# Patient Record
Sex: Female | Born: 1990 | Race: White | Hispanic: No | Marital: Single | Smoking: Former smoker
Health system: Southern US, Community
[De-identification: ages and names within clinical notes are randomized; demographics above are authoritative.]

## PROBLEM LIST (undated history)

## (undated) DIAGNOSIS — N390 Urinary tract infection, site not specified: Secondary | ICD-10-CM

## (undated) DIAGNOSIS — N289 Disorder of kidney and ureter, unspecified: Secondary | ICD-10-CM

## (undated) DIAGNOSIS — F419 Anxiety disorder, unspecified: Secondary | ICD-10-CM

---

## 2014-03-26 ENCOUNTER — Emergency Department (HOSPITAL_BASED_OUTPATIENT_CLINIC_OR_DEPARTMENT_OTHER)
Admission: EM | Admit: 2014-03-26 | Discharge: 2014-03-26 | Disposition: A | Discharge status: Home / Self Care | Payer: Self-pay | Attending: Emergency Medicine | Admitting: Emergency Medicine

## 2014-03-26 ENCOUNTER — Encounter (HOSPITAL_BASED_OUTPATIENT_CLINIC_OR_DEPARTMENT_OTHER): Payer: Self-pay

## 2014-03-26 DIAGNOSIS — R251 Tremor, unspecified: Secondary | ICD-10-CM | POA: Insufficient documentation

## 2014-03-26 DIAGNOSIS — Z720 Tobacco use: Secondary | ICD-10-CM | POA: Insufficient documentation

## 2014-03-26 DIAGNOSIS — Z79899 Other long term (current) drug therapy: Secondary | ICD-10-CM | POA: Insufficient documentation

## 2014-03-26 DIAGNOSIS — Z3202 Encounter for pregnancy test, result negative: Secondary | ICD-10-CM | POA: Insufficient documentation

## 2014-03-26 DIAGNOSIS — G252 Other specified forms of tremor: Secondary | ICD-10-CM

## 2014-03-26 DIAGNOSIS — F419 Anxiety disorder, unspecified: Secondary | ICD-10-CM | POA: Insufficient documentation

## 2014-03-26 HISTORY — DX: Anxiety disorder, unspecified: F41.9

## 2014-03-26 LAB — BASIC METABOLIC PANEL
Anion gap: 2 — ABNORMAL LOW (ref 5–15)
BUN: 12 mg/dL (ref 6–23)
CO2: 28 mmol/L (ref 19–32)
Calcium: 8.8 mg/dL (ref 8.4–10.5)
Chloride: 109 mmol/L (ref 96–112)
Creatinine, Ser: 0.81 mg/dL (ref 0.50–1.10)
GFR calc Af Amer: >90 mL/min (ref >90)
GFR calc non Af Amer: >90 mL/min (ref >90)
Glucose, Bld: 82 mg/dL (ref 70–99)
Potassium: 3.6 mmol/L (ref 3.5–5.1)
Sodium: 139 mmol/L (ref 135–145)

## 2014-03-26 LAB — URINALYSIS, ROUTINE W REFLEX MICROSCOPIC
Bilirubin Urine: NEGATIVE
Glucose, UA: NEGATIVE mg/dL
Hgb urine dipstick: NEGATIVE
KETONES UR: NEGATIVE mg/dL
Leukocytes, UA: NEGATIVE
NITRITE: NEGATIVE
Protein, ur: NEGATIVE mg/dL
SPECIFIC GRAVITY, URINE: 1.025 (ref 1.005–1.030)
Urobilinogen, UA: 1 mg/dL (ref 0.0–1.0)
pH: 6 (ref 5.0–8.0)

## 2014-03-26 LAB — CBC WITH DIFFERENTIAL/PLATELET
Basophils Absolute: 0 10*3/uL (ref 0.0–0.1)
Basophils Relative: 0 % (ref 0–1)
Eosinophils Absolute: 0.3 10*3/uL (ref 0.0–0.7)
Eosinophils Relative: 3 % (ref 0–5)
HCT: 43.1 % (ref 36.0–46.0)
Hemoglobin: 14.5 g/dL (ref 12.0–15.0)
Lymphocytes Relative: 28 % (ref 12–46)
Lymphs Abs: 2.2 10*3/uL (ref 0.7–4.0)
MCH: 29.8 pg (ref 26.0–34.0)
MCHC: 33.6 g/dL (ref 30.0–36.0)
MCV: 88.7 fL (ref 78.0–100.0)
Monocytes Absolute: 0.6 10*3/uL (ref 0.1–1.0)
Monocytes Relative: 7 % (ref 3–12)
Neutro Abs: 4.9 10*3/uL (ref 1.7–7.7)
Neutrophils Relative %: 62 % (ref 43–77)
Platelets: 166 10*3/uL (ref 150–400)
RBC: 4.86 MIL/uL (ref 3.87–5.11)
RDW: 12.7 % (ref 11.5–15.5)
WBC: 7.9 10*3/uL (ref 4.0–10.5)

## 2014-03-26 LAB — RAPID URINE DRUG SCREEN, HOSP PERFORMED
Amphetamines: NOT DETECTED
Barbiturates: NOT DETECTED
Benzodiazepines: POSITIVE — AB
Cocaine: NOT DETECTED
Opiates: NOT DETECTED
Tetrahydrocannabinol: POSITIVE — AB

## 2014-03-26 LAB — PREGNANCY, URINE: PREG TEST UR: NEGATIVE

## 2014-03-26 MED ORDER — LORAZEPAM 1 MG PO TABS: 1.0000 mg | ORAL_TABLET | Freq: Two times a day (BID) | ORAL | Status: DC | PRN

## 2014-03-26 MED ORDER — LORAZEPAM 1 MG PO TABS
1.0000 mg | ORAL_TABLET | Freq: Once | ORAL | Status: AC
  Administered 2014-03-26: 1 mg via ORAL
  Filled 2014-03-26: qty 1

## 2014-03-26 NOTE — ED Notes (Signed)
Pt reports tremors x 3 days. Sts she shakes no matter what she is doing.

## 2014-03-26 NOTE — Discharge Instructions (Signed)
°Emergency Department Resource Guide °1) Find a Doctor and Pay Out of Pocket °Although you won't have to find out who is covered by your insurance plan, it is a good idea to ask around and get recommendations. You will then need to call the office and see if the doctor you have chosen will accept you as a new patient and what types of options they offer for patients who are self-pay. Some doctors offer discounts or will set up payment plans for their patients who do not have insurance, but you will need to ask so you aren't surprised when you get to your appointment. ° °2) Contact Your Local Health Department °Not all health departments have doctors that can see patients for sick visits, but many do, so it is worth a call to see if yours does. If you don't know where your local health department is, you can check in your phone book. The CDC also has a tool to help you locate your state's health department, and many state websites also have listings of all of their local health departments. ° °3) Find a Walk-in Clinic °If your illness is not likely to be very severe or complicated, you may want to try a walk in clinic. These are popping up all over the country in pharmacies, drugstores, and shopping centers. They're usually staffed by nurse practitioners or physician assistants that have been trained to treat common illnesses and complaints. They're usually fairly quick and inexpensive. However, if you have serious medical issues or chronic medical problems, these are probably not your best option. ° °No Primary Care Doctor: °- Call [facility] at  832-8000 - they can help you locate a primary care doctor that  accepts your insurance, provides certain services, etc. °- Physician Referral Service- 1-800-533-3463 ° °Chronic Pain Problems: °Organization         Address  Phone   Notes  °[facility]  (336) 297-2271 Patients need to be referred by their primary care doctor.  ° °Medication  Assistance: °Organization         Address  Phone   Notes  °[place] [facility] [address] °[place], [place] [address] (336) 641-8030 --Must be a resident of [place] °-- Must have NO insurance coverage whatsoever (no Medicaid/ Medicare, etc.) °-- The pt. MUST have a primary care doctor that directs their care regularly and follows them in the community °  °[facility]  (866) 331-1348   °[employer]  (888) 892-1162   ° °Agencies that provide inexpensive medical care: °Organization         Address  Phone   Notes  °[facility]  (336) 832-8035   °[facility]    (336) 832-7272   °[facility] [address] °[place], [place] [address] (336) 832-4777   °[facility] [address], °[place] (336) 271-4999   °[facility]    (336) 373-0678   °[facility]    (336) 272-1050   °[facility] ° [address], [place] Phone:  (336) 832-4444, Fax:  (336) 832-4440 Hours of Operation:  9 am - 6 pm, M-F.  Also accepts Medicaid/Medicare and self-pay.  °[facility] ° [address], [place] Phone: (336) 832-3150, Fax: (336) 832-3151. Hours of Operation:  8:30 am - 5:30 pm, M-F.  Also accepts Medicaid and self-pay.  °[facility] [place] [address], [place] Phone: (336) 878-6027   °[facility] [address], [place], [place] (336)723-1848, Ext. 123 Mondays & Thursdays: 7-9 AM.  First 15 patients are seen on a first come, first serve basis. °  ° °Medicaid-accepting [place] Providers: ° °Organization         Address  Phone   Notes  °[facility] [address], [place] (336) 641-2100 Also accepts self-pay patients.  °[facility] [address], [place] ° (336) 856-9996   °[facility] [address], [place]  (336) 288-8857   °[facility] [address] [place] [address], [place] (336) 299-7000   °Veita Bland [address], [place]  ° (336) 373-1557 Only accepts [place] Access Medicaid patients after they have their name applied to their card.  ° °Self-Pay (no insurance) in [place]: ° °Organization         Address  Phone   Notes  °Sickle Cell Patients, [facility] [address], [place] (336) 832-1970   °[facility] [address], [place] (336) 832-4400   ° [facility] ° [address], [place] (336) 992-4800   °[facility]/Dr. Osei-Bonsu ° [address] [place] [address], [place] or [address], [place] (336) 841-8500 Phone number for both [place] and [place] locations is the same.  °[facility] [address], [place] (336) 299-0000   °[facility] [address] [place] [address], [place] or [address] (336) 852-7530 °(336) 878-2260   °[facility] [address], [place] (336) 350-1642, phone; (336) 294-5005, fax Sees patients 1st and 3rd Saturday of every month.  Must not qualify for public or private insurance (i.e. Medicaid, Medicare, [place] Health Choice, Veterans' Benefits) • Household income should be no more than 200% of the poverty level •The clinic cannot treat you if you are pregnant or think you are pregnant • Sexually transmitted diseases are not treated at the clinic.  ° ° °Dental Care: °Organization         Address  Phone  Notes  °[place] [facility] [address], [place] (336) 641-6152 Accepts children up to age 21 who are enrolled in Medicaid or [place] Health Choice; pregnant women with a Medicaid card; and children who have applied for Medicaid or [place] Health Choice, but were declined, whose parents can pay a reduced fee at time of service.  °[place]  [facility] [place]  [address], [place] (336) 641-7733 Accepts children up to age 21 who are enrolled in Medicaid or [place] Health Choice; pregnant women with a Medicaid card; and children who have applied for Medicaid or [place] Health Choice, but were declined, whose parents can pay a reduced fee at time of service.  °[facility] ° [address], [place] (336) 641-4533 Patients are seen by appointment only. Walk-ins are not accepted. [facility] will see patients 18 years of age and older. °Monday - Tuesday (8am-5pm) °Most Wednesdays (8:30-5pm) °$30 per visit, cash only  °[facility] ° [address], [place] (336) 641-4533 Patients are seen by appointment only. Walk-ins are not accepted. [facility] will see patients 18 years of age and older. °One   Wednesday Evening (Monthly: Volunteer Based).  $30 per visit, cash only  °[facility]  (919) 537-3737 for adults; Children under age 4, call Graduate Pediatric Dentistry at (919) [phone]. Children aged 4-14, please call (919) [phone] to request a pediatric application. ° Dental services are provided in all areas of dental care including fillings, crowns and bridges, complete and partial dentures, implants, gum treatment, root canals, and extractions. Preventive care is also provided. Treatment is provided to both adults and children. °Patients are selected via a lottery and there is often a waiting list. °  °[facility] [address], °[place] ° (336) 763-8833 [email or web address] °  °[facility] [address], [place], [place] (336)723-1848, Ext. 123 Second and Fourth Thursday of each month, opens at 6:30 AM; Clinic ends at 9 AM.  Patients are seen on a first-come first-served basis, and a limited number are seen during each clinic.  ° °[facility] ° [address], [place], [place] (336) 723-7904    Eligibility Requirements °You must have lived in [place], [place], or [place] counties for at least the last three months. °  You cannot be eligible for state or federal sponsored healthcare insurance, including Veterans Administration, Medicaid, or Medicare. °  You generally cannot be eligible for healthcare insurance through your employer.  °  How to apply: °Eligibility screenings are held every Tuesday and Wednesday afternoon from 1:00 pm until 4:00 pm. You do not need an appointment for the interview!  °[facility] [address], [place], [place] 336-631-2330   °[facility]  336-342-8273   °[facility]  336-703-3100   °[facility]  336-570-6415   ° °Behavioral Health Resources in the Community: °Intensive Outpatient Programs °Organization         Address  Phone  Notes  °[facility] [address], [place], [place] 336-878-6098   °[facility] [address], [place], [place] 336-832-9800   °[facility] [address], [place], [place] ° 336-882-2125   °[facility] [address],  °[place], [place] 1-800-853-5163 or 336-641-4981   °Substance Abuse Resources °Organization         Address  Phone  Notes  °[facility]  336-882-2125   °[facility]  336-784-9470   °[facility]  336-285-9073   °[facility]  336-845-3988   °Residential & Outpatient Substance Abuse Program  1-800-659-3381   °Psychological Services °Organization         Address  Phone  Notes  °[facility]  336- 832-9600   °[facility]  336- 378-7881   °[facility] [address], [place] 1-800-853-5163 or 336-641-4981   ° °Mobile Crisis Teams °Organization         Address  Phone  Notes  °[facility], Mobile Crisis Care Unit  1-877-626-1772   °Assertive °[facility] ° [address]  [place], [place] 336-834-9664   °Sharon DeEsch [address] °[place] 336-554-5454   ° °Self-Help/Support Groups °Organization         Address  Phone             Notes  °[facility] - variety of support groups  336- 373-1402 Call for more information  °Narcotics Anonymous (NA), [facility] [address], °[place]  2 meetings at this location  ° °  Residential Treatment Programs °Organization         Address  Phone  Notes  °[facility] [address],    °[place]  1-866-801-8205   °[facility] ° [address], [place], [place] 704-293-8524   °[facility] [address], [place] 336-845-3988 Admissions: 8am-3pm M-F  °[facility] [address]    °[place], [place] 336-841-1104   °[facility] [address], [place], [place] 336-379-7146   °[facility] [address]  °[place], [place] 336-285-9073   °[facility] - Intensive Outpatient [address], [place], [place] 336-852-3033   °[facility] [address]  °[place], [place] 1-877-615-2722 or 336-784-9470   °[facility] [address] , [place] 336-227-7417 Accepts Medicaid  °[facility] [address]  °[place] 1-800-659-3381 Substance Abuse/Addiction Treatment  ° °[place] Behavioral Health Resources °Organization         Address  Phone  Notes  °[facility]  (888) 581-9988   °Julie Brannon, PhD [address] [place], [place]   (336) 349-5553 or (336) 951-0000   °[facility]   [address] °[place], [place] (336) 349-4454   °[facility] [address], [place], [place] (336) 342-8316 Insurance/Medicaid/sponsorship through [facility]  °[facility] [address]                                    [place], [place] (336) 342-8316 Therapy/tele-psych/case    °[facility] [address]  ° [place], [place] (336) 349-2233    °Dr. Arfeen  (336) 349-4544   °[facility] [place]  [employer] [place] [facility] 1) [address], [place] °2) [address], [place] °3)  [address], [place] (336) 349-3220 °(336) 342-7768 ° °(336) 342-8140   °[place] [facility] (336) 342-1394 or (336) 342-3537 (After Hours)    ° ° °

## 2014-03-26 NOTE — ED Provider Notes (Signed)
CSN: [id number]     Arrival date & time 03/26/14  1402 History   First MD Initiated Contact with Patient 03/26/14 1414     Chief Complaint  Patient presents with  . Tremors     (Consider location/radiation/quality/duration/timing/severity/associated sxs/prior Treatment) HPI  Pt is a 24yo female with hx of anxiety, presenting to ED with c/o diffuse body shaking that started 3 days. Pt states she can have shaking in her arms or legs and throughout her body no matter what she does, including walking and sitting at rest.  Reports 2-3 episodes of "blacking out" w/o syncope. States she was about to get into the shower one time when her vision became black, she had to grab the wall for support. Incident lasted about 20 seconds. Denies any headache or other pain with vision change or with tremor.  Denies fever, chills, n/v/d. Denies recent travel or sick contacts. Reports having consumed caffeine daily in the past, however, now drinks 1 soda occasionally but usually just water. Pt also reports only eating half of her meals when she use to eat full meals. Denies any OTC supplement use or daily medications at this time. Reports taking an anxiety medication about 3 years ago but cannot recall name of medication. No known FH of neurologic disease.  No hx of seizures. Denies alcohol or drug use.    Past Medical History  Diagnosis Date  . Anxiety    History reviewed. No pertinent past surgical history. No family history on file. History  Substance Use Topics  . Smoking status: Current Every Day Smoker  . Smokeless tobacco: Not on file  . Alcohol Use: No   OB History    No data available     Review of Systems  Constitutional: Negative for fever, chills, diaphoresis, appetite change and fatigue.  Eyes: Positive for visual disturbance. Negative for photophobia, pain and redness.  Respiratory: Negative for cough and shortness of breath.   Gastrointestinal: Negative for nausea, vomiting, abdominal pain  and diarrhea.  Neurological: Positive for dizziness, tremors ( diffuse) and light-headedness. Negative for seizures, syncope, speech difficulty, weakness, numbness and headaches.  Psychiatric/Behavioral: Negative for suicidal ideas, behavioral problems, confusion, self-injury and agitation. The patient is nervous/anxious.   All other systems reviewed and are negative.     Allergies  Review of patient's allergies indicates no known allergies.  Home Medications   Prior to Admission medications   Medication Sig Start Date End Date Taking? Authorizing Provider  LORazepam (ATIVAN) 1 MG tablet Take 1 tablet (1 mg total) by mouth 2 (two) times daily as needed for anxiety. 03/26/14   Junius Finner, PA-C   BP 138/75 mmHg  Pulse 91  Temp(Src) 98.5 F (36.9 C) (Oral)  Resp 16  Ht  (1.702 m)  Wt 125 lb (56.7 kg)  BMI 19.57 kg/m2  SpO2 100%  LMP 03/26/2014 (Exact Date) Physical Exam  Constitutional: She is oriented to person, place, and time. She appears well-developed and well-nourished. No distress.  HENT:  Head: Normocephalic and atraumatic.  Eyes: Conjunctivae and EOM are normal. Pupils are equal, round, and reactive to light. Right eye exhibits no discharge. Left eye exhibits no discharge. No scleral icterus.  Neck: Normal range of motion. Neck supple.  Cardiovascular: Normal rate, regular rhythm and normal heart sounds.   Pulmonary/Chest: Effort normal and breath sounds normal. No respiratory distress. She has no wheezes. She has no rales. She exhibits no tenderness.  Abdominal: Soft. Bowel sounds are normal. She exhibits no distension  and no mass. There is no tenderness. There is no rebound and no guarding.  Musculoskeletal: Normal range of motion.  Neurological: She is alert and oriented to person, place, and time. She has normal strength. No cranial nerve deficit or sensory deficit. She displays a negative Romberg sign. Gait abnormal. Coordination normal. GCS eye subscore is 4.  GCS verbal subscore is 5. GCS motor subscore is 6.  CN II-XII in tact. Speech is fluent, alert and oriented to person, place and time. Diffuse tremor, most noticeable in left leg while pt lying in bed.  With standing during romberg test, no pronator drift, however, diffuse body shaking also present while ambulating but safely able to ambulate around room w/o assistance. 5/5 strength in upper and lower extremities bilaterally.   Skin: Skin is warm and dry. She is not diaphoretic.  Psychiatric: Her speech is normal and behavior is normal. Thought content normal. Her mood appears anxious. She expresses no homicidal and no suicidal ideation. She expresses no suicidal plans and no homicidal plans.  Nursing note and vitals reviewed.   ED Course  Procedures (including critical care time) Labs Review Labs Reviewed  BASIC METABOLIC PANEL - Abnormal; Notable for the following:    Anion gap 2 (*)    All other components within normal limits  URINE RAPID DRUG SCREEN (HOSP PERFORMED) - Abnormal; Notable for the following:    Benzodiazepines POSITIVE (*)    Tetrahydrocannabinol POSITIVE (*)    All other components within normal limits  PREGNANCY, URINE  URINALYSIS, ROUTINE W REFLEX MICROSCOPIC  CBC WITH DIFFERENTIAL/PLATELET    Imaging Review No results found.   EKG Interpretation None      MDM   Final diagnoses:  Coarse tremors  Anxiety    Pt with hx of anxiety presenting to ED with c/o diffuse body tremors for 3 days.  No hx of same. No FH of neurologic issues.  Denies headache. 5/5 strength in upper and lower extremities bilaterally. Concern for MS vs anxiety vs drug use/toxin vs electrolyte imbalance  Labs: positive for benzodiazepines and tetrahydrocannabinol in her system. Pt denies taking any drugs or medications.   3:32 PM Pt has received 1mg  ativan, still has full body tremor/shaking. Pt states she still does not feel any different.  Discussed pt with Dr. Micheline Mazeocherty who also  examined pt. Tremor was distractable for Dr. Micheline Mazeocherty.  No evidence of emergent process taking place at this time. Doubt CVA, SAH, meningitis.  Will discharge pt home with 6 tabs of ativan for anxiety and tremor. [facility] resource guide provided. Advised pt to establish care with a PCP for ongoing healthcare needs, medication refills and recheck of symptoms. Return precautions provided. Pt verbalized understanding and agreement with tx plan.     Junius Finnerrin O'Malley, PA-C 03/26/14 1750  Toy CookeyMegan Docherty, MD 03/27/14 [phone]

## 2014-03-26 NOTE — ED Notes (Signed)
Pt to bathroom to urinate for specimen collection.

## 2014-03-29 ENCOUNTER — Emergency Department (INDEPENDENT_AMBULATORY_CARE_PROVIDER_SITE_OTHER)
Admission: EM | Admit: 2014-03-29 | Discharge: 2014-03-29 | Disposition: A | Discharge status: Home / Self Care | Payer: Self-pay | Source: Home / Self Care | Attending: Family Medicine | Admitting: Family Medicine

## 2014-03-29 ENCOUNTER — Encounter: Payer: Self-pay | Admitting: *Deleted

## 2014-03-29 DIAGNOSIS — F329 Major depressive disorder, single episode, unspecified: Secondary | ICD-10-CM

## 2014-03-29 DIAGNOSIS — F411 Generalized anxiety disorder: Secondary | ICD-10-CM

## 2014-03-29 DIAGNOSIS — F32A Depression, unspecified: Secondary | ICD-10-CM

## 2014-03-29 DIAGNOSIS — R251 Tremor, unspecified: Secondary | ICD-10-CM

## 2014-03-29 HISTORY — DX: Urinary tract infection, site not specified: N39.0

## 2014-03-29 HISTORY — DX: Disorder of kidney and ureter, unspecified: N28.9

## 2014-03-29 MED ORDER — SERTRALINE HCL 50 MG PO TABS: 50.0000 mg | ORAL_TABLET | Freq: Every day | ORAL | Status: DC

## 2014-03-29 NOTE — ED Provider Notes (Signed)
CSN: [id number]     Arrival date & time 03/29/14  1240 History   First MD Initiated Contact with Patient 03/29/14 1308     Chief Complaint  Patient presents with  . Tremors  . Loss of Consciousness  . Anxiety      HPI Comments: Patient presents with her boyfriend complaining of five day history of generalized "shaking" and tremors.  She states that she has had increased stress for about a month, especially at work, and is worried about starting school in May.  She has had several panic attacks during the past month.  She reports that she has been mildly depressed and feels consistently anxious.  During the past 1.5 weeks she has had difficulty falling asleep and early morning awakening, sleeping only about 3 to 4 hours per night.  She denies suicidal ideation. She visited the [facility] three days ago for her symptoms.  There, a drug screen was positive for benzodiazepines and tetrahydrocannabinol.  She was diagnosed with anxiety and tremor.  She was discharged with a small quantity of Ativan and advised to follow-up with a PCP. She reports that her symptoms became worse after taking Ativan yesterday and she briefly lost consciousness while at work.  Patient is a 24 y.o. female presenting with anxiety. The history is provided by the patient and a friend.  Anxiety This is a new problem. The current episode started more than 1 week ago. The problem occurs constantly. Associated symptoms include abdominal pain and headaches. Pertinent negatives include no chest pain and no shortness of breath. The symptoms are aggravated by stress. Nothing relieves the symptoms. Treatments tried: Ativan. The treatment provided no relief.    Past Medical History  Diagnosis Date  . Anxiety   . Recurrent UTI   . Kidney problem    History reviewed. No pertinent past surgical history. History reviewed. No pertinent family history. History  Substance Use Topics  . Smoking status: Current Every Day  Smoker -- 0.50 packs/day    Types: Cigarettes  . Smokeless tobacco: Not on file  . Alcohol Use: No   OB History    No data available     Review of Systems  Constitutional: Positive for fatigue. Negative for fever and chills.  Eyes: Negative.   Respiratory: Negative.  Negative for shortness of breath.   Cardiovascular: Negative.  Negative for chest pain.  Gastrointestinal: Positive for abdominal pain.  Genitourinary: Negative.   Musculoskeletal: Negative.   Skin: Negative.   Neurological: Positive for dizziness, tremors, syncope, light-headedness and headaches. Negative for seizures, facial asymmetry, speech difficulty, weakness and numbness.  Psychiatric/Behavioral: Positive for sleep disturbance and decreased concentration. Negative for suicidal ideas, hallucinations and confusion. The patient is nervous/anxious.     Allergies  Review of patient's allergies indicates no known allergies.  Home Medications   Prior to Admission medications   Medication Sig Start Date End Date Taking? Authorizing Provider  LORazepam (ATIVAN) 1 MG tablet Take 1 tablet (1 mg total) by mouth 2 (two) times daily as needed for anxiety. 03/26/14   Junius Finner, PA-C  sertraline (ZOLOFT) 50 MG tablet Take 1 tablet (50 mg total) by mouth daily. 03/29/14   Lattie Haw, MD   BP 130/76 mmHg  Pulse 81  Temp(Src) 98.4 F (36.9 C) (Oral)  Resp 16  Ht  (1.702 m)  Wt 137 lb (62.143 kg)  BMI 21.45 kg/m2  SpO2 98%  LMP 03/26/2014 (Exact Date) Physical Exam Nursing notes and Vital  Signs reviewed. Appearance:  Patient appears stated age, and in no acute distress.  She has constant rhythmic movements of her extremities which persist somewhat when she is distracted. Psychiatric:  Patient is alert and oriented with good eye contact.  Thoughts are organized.  No psychomotor retardation.  Memory intact.  Mildly depressed mood, flat affect.  Not suicidal  Eyes:  Pupils are equal, round, and reactive to light  and accomodation.  Extraocular movement is intact.  Conjunctivae are not inflamed  Ears:  Canals normal.  Tympanic membranes normal.  Nose: Normal turbinates.  No sinus tenderness.   Mouth/Pharynx:  Normal Neck:  Supple.   No adenopathy or thyromegaly  Lungs:  Clear to auscultation.  Breath sounds are equal.  Heart:  Regular rate and rhythm without murmurs, rubs, or gallops.  Abdomen:  Nontender without masses or hepatosplenomegaly.  Bowel sounds are present.  No CVA or flank tenderness.  Extremities:  No edema.  No calf tenderness Neurologic:  Cranial nerves 2 through 12 are normal.  Patellar, achilles, and elbow reflexes are normal.  Cerebellar function is intact (finger-to-nose and rapid alternating hand movement).  Gait and station are normal.   Romberg negative.  Persistent rhythmic tremor of extremities is present. Skin:  No rash present.   ED Course  Procedures  none     MDM   1. Generalized anxiety disorder   2. Depression   3. Tremor    Begin Zoloft 50mg  once daily.  Rx #10, no refill. Followup with a PCP in 7 to 10 days. Stop all anxiety medications. Make a follow-up appointment with neurologist for evaluation of tremor.    Lattie HawStephen A Beese, MD 04/03/14 [phone]

## 2014-03-29 NOTE — Discharge Instructions (Signed)
Stop all anxiety medications. Make a follow-up appointment with neurologist for evaluation of tremor.   Tremor Tremor is a rhythmic, involuntary muscular contraction characterized by oscillations (to-and-fro movements) of a part of the body. The most common of all involuntary movements, tremor can affect various body parts such as the hands, head, facial structures, vocal cords, trunk, and legs; most tremors, however, occur in the hands. Tremor often accompanies neurological disorders associated with aging. Although the disorder is not life-threatening, it can be responsible for functional disability and social embarrassment. TREATMENT  There are many types of tremor and several ways in which tremor is classified. The most common classification is by behavioral context or position. There are five categories of tremor within this classification: resting, postural, kinetic, task-specific, and psychogenic. Resting or static tremor occurs when the muscle is at rest, for example when the hands are lying on the lap. This type of tremor is often seen in patients with Parkinson's disease. Postural tremor occurs when a patient attempts to maintain posture, such as holding the hands outstretched. Postural tremors include physiological tremor, essential tremor, tremor with basal ganglia disease (also seen in patients with Parkinson's disease), cerebellar postural tremor, tremor with peripheral neuropathy, post-traumatic tremor, and alcoholic tremor. Kinetic or intention (action) tremor occurs during purposeful movement, for example during finger-to-nose testing. Task-specific tremor appears when performing goal-oriented tasks such as handwriting, speaking, or standing. This group consists of primary writing tremor, vocal tremor, and orthostatic tremor. Psychogenic tremor occurs in both older and younger patients. The key feature of this tremor is that it dramatically lessens or disappears when the patient is  distracted. PROGNOSIS There are some treatment options available for tremor; the appropriate treatment depends on accurate diagnosis of the cause. Some tremors respond to treatment of the underlying condition, for example in some cases of psychogenic tremor treating the patient's underlying mental problem may cause the tremor to disappear. Also, patients with tremor due to Parkinson's disease may be treated with Levodopa drug therapy. Symptomatic drug therapy is available for several other tremors as well. For those cases of tremor in which there is no effective drug treatment, physical measures such as teaching the patient to brace the affected limb during the tremor are sometimes useful. Surgical intervention such as thalamotomy or deep brain stimulation may be useful in certain cases. Document Released: 01/26/2002 Document Revised: 04/30/2011 Document Reviewed: 02/05/2005 [facility] Patient Information 2015 [place], [place]. This information is not intended to replace advice given to you by your health care provider. Make sure you discuss any questions you have with your health care provider.

## 2014-03-29 NOTE — ED Notes (Addendum)
Pt c/o tremors, syncope episodes, and anxiety x 5 days. She reports going to the ED 03/27/14 was given Lorazepam 1mg  BID. She reports taking it before she went into work yesterday and passed out shortly after. She reports a hx of Anxiety 1-2 years ago, but never followed up with anybody about it.

## 2014-04-05 ENCOUNTER — Telehealth: Payer: Self-pay | Admitting: *Deleted

## 2017-03-26 ENCOUNTER — Emergency Department (HOSPITAL_BASED_OUTPATIENT_CLINIC_OR_DEPARTMENT_OTHER)
Admission: EM | Admit: 2017-03-26 | Discharge: 2017-03-26 | Disposition: A | Discharge status: Home / Self Care | Payer: Self-pay | Attending: Emergency Medicine | Admitting: Emergency Medicine

## 2017-03-26 ENCOUNTER — Encounter (HOSPITAL_BASED_OUTPATIENT_CLINIC_OR_DEPARTMENT_OTHER): Payer: Self-pay | Admitting: *Deleted

## 2017-03-26 ENCOUNTER — Emergency Department (HOSPITAL_BASED_OUTPATIENT_CLINIC_OR_DEPARTMENT_OTHER): Payer: Self-pay

## 2017-03-26 ENCOUNTER — Other Ambulatory Visit: Payer: Self-pay

## 2017-03-26 DIAGNOSIS — J3489 Other specified disorders of nose and nasal sinuses: Secondary | ICD-10-CM | POA: Insufficient documentation

## 2017-03-26 DIAGNOSIS — R1110 Vomiting, unspecified: Secondary | ICD-10-CM | POA: Insufficient documentation

## 2017-03-26 DIAGNOSIS — J111 Influenza due to unidentified influenza virus with other respiratory manifestations: Secondary | ICD-10-CM | POA: Insufficient documentation

## 2017-03-26 DIAGNOSIS — R05 Cough: Secondary | ICD-10-CM | POA: Insufficient documentation

## 2017-03-26 DIAGNOSIS — R062 Wheezing: Secondary | ICD-10-CM | POA: Insufficient documentation

## 2017-03-26 DIAGNOSIS — R0981 Nasal congestion: Secondary | ICD-10-CM | POA: Insufficient documentation

## 2017-03-26 DIAGNOSIS — R059 Cough, unspecified: Secondary | ICD-10-CM

## 2017-03-26 DIAGNOSIS — R69 Illness, unspecified: Secondary | ICD-10-CM

## 2017-03-26 DIAGNOSIS — Z79899 Other long term (current) drug therapy: Secondary | ICD-10-CM | POA: Insufficient documentation

## 2017-03-26 DIAGNOSIS — Z87891 Personal history of nicotine dependence: Secondary | ICD-10-CM | POA: Insufficient documentation

## 2017-03-26 DIAGNOSIS — R509 Fever, unspecified: Secondary | ICD-10-CM | POA: Insufficient documentation

## 2017-03-26 LAB — URINALYSIS, ROUTINE W REFLEX MICROSCOPIC
BILIRUBIN URINE: NEGATIVE
Glucose, UA: NEGATIVE mg/dL
Hgb urine dipstick: NEGATIVE
KETONES UR: NEGATIVE mg/dL
Leukocytes, UA: NEGATIVE
NITRITE: NEGATIVE
PH: 8 (ref 5.0–8.0)
Protein, ur: NEGATIVE mg/dL
Specific Gravity, Urine: 1.02 (ref 1.005–1.030)

## 2017-03-26 LAB — PREGNANCY, URINE: Preg Test, Ur: NEGATIVE

## 2017-03-26 MED ORDER — PREDNISONE 20 MG PO TABS
40.0000 mg | ORAL_TABLET | Freq: Once | ORAL | Status: AC
  Administered 2017-03-26: 40 mg via ORAL
  Filled 2017-03-26: qty 2

## 2017-03-26 MED ORDER — ACETAMINOPHEN 325 MG PO TABS
650.0000 mg | ORAL_TABLET | Freq: Once | ORAL | Status: AC
  Administered 2017-03-26: 650 mg via ORAL
  Filled 2017-03-26: qty 2

## 2017-03-26 MED ORDER — IPRATROPIUM-ALBUTEROL 0.5-2.5 (3) MG/3ML IN SOLN
3.0000 mL | Freq: Once | RESPIRATORY_TRACT | Status: AC
  Administered 2017-03-26: 3 mL via RESPIRATORY_TRACT
  Filled 2017-03-26: qty 3

## 2017-03-26 MED ORDER — ALBUTEROL SULFATE HFA 108 (90 BASE) MCG/ACT IN AERS: 1.0000 | INHALATION_SPRAY | Freq: Four times a day (QID) | RESPIRATORY_TRACT | 0 refills | Status: DC | PRN

## 2017-03-26 MED ORDER — BENZONATATE 100 MG PO CAPS: 100.0000 mg | ORAL_CAPSULE | Freq: Three times a day (TID) | ORAL | 0 refills | Status: DC

## 2017-03-26 MED ORDER — PREDNISONE 20 MG PO TABS: 40.0000 mg | ORAL_TABLET | Freq: Every day | ORAL | 0 refills | Status: DC

## 2017-03-26 MED ORDER — ONDANSETRON 4 MG PO TBDP
4.0000 mg | ORAL_TABLET | Freq: Once | ORAL | Status: AC | PRN
  Administered 2017-03-26: 4 mg via ORAL
  Filled 2017-03-26: qty 1

## 2017-03-26 NOTE — Discharge Instructions (Signed)
Your chest x-ray was normal.  Your urine did not show any signs of infection.  Your pregnancy test was negative.  Your symptoms seem consistent with a viral illness.  Have given you Tessalon for cough, prednisone start taking tomorrow for the next 4 days along with the albuterol inhaler for cough and wheezing.  Avoid smoking.  Drink plenty of fluids.  Motrin and Tylenol for pain and fevers return to the ED with any worsening symptoms.  Follow-up with primary care doctor 1-2 days.

## 2017-03-26 NOTE — ED Notes (Signed)
ED Provider at bedside. 

## 2017-03-26 NOTE — ED Triage Notes (Signed)
Cough x 1 week. Chills and body aches last night. Vomited x 2.

## 2017-03-26 NOTE — ED Provider Notes (Signed)
[facility] EMERGENCY DEPARTMENT Provider Note   CSN: [id number] Arrival date & time: 03/26/17  1015     History   Chief Complaint Chief Complaint  Patient presents with  . Generalized Body Aches  . Emesis    HPI Heidi Bowman is a 27 y.o. female.  HPI 27 year old Caucasian female with no pertinent past medical history presents to the emergency department today with influenza-like illness.  Patient complains of a productive cough for the past week and a half.  She also reports some intermittent wheezes.  Patient with occasional tobacco use.  Patient reports that yesterday she developed generalized body aches, chills, subjective fevers and 2-3 episodes of posttussive emesis.  She does reports nasal congestion and rhinorrhea.  Patient denies any associated sore throat.  She denies any associated abdominal pain, urinary symptoms, vaginal discharge, vaginal bleeding, change in bowel habits.  Patient states that there are some sick coworkers around her with the same symptoms.  She denies receiving influenza vaccine this year.  Reports yellow-green sputum production.  She is not taking for symptoms prior to arrival.  Nothing makes better or worse.  Patient also wants to make sure that she is not pregnant because there is a possibility that she could be.  Pt denies any vision changes, lightheadedness, dizziness, congestion, neck pain, cp, sob, abd pain, urinary symptoms, change in bowel habits, melena, hematochezia, lower extremity paresthesias.  Past Medical History:  Diagnosis Date  . Anxiety   . Kidney problem   . Recurrent UTI     There are no active problems to display for this patient.   History reviewed. No pertinent surgical history.  OB History    No data available       Home Medications    Prior to Admission medications   Medication Sig Start Date End Date Taking? Authorizing Provider  albuterol (PROVENTIL HFA;VENTOLIN HFA) 108 (90 Base) MCG/ACT inhaler  Inhale 1-2 puffs into the lungs every 6 (six) hours as needed for wheezing or shortness of breath. 03/26/17   Rise Mu, PA-C  benzonatate (TESSALON) 100 MG capsule Take 1 capsule (100 mg total) by mouth every 8 (eight) hours. 03/26/17   Rise Mu, PA-C  LORazepam (ATIVAN) 1 MG tablet Take 1 tablet (1 mg total) by mouth 2 (two) times daily as needed for anxiety. 03/26/14   Lurene Shadow, PA-C  predniSONE (DELTASONE) 20 MG tablet Take 2 tablets (40 mg total) by mouth daily with breakfast. 03/26/17   Rise Mu, PA-C  sertraline (ZOLOFT) 50 MG tablet Take 1 tablet (50 mg total) by mouth daily. 03/29/14   Lattie Haw, MD    Family History No family history on file.  Social History Social History   Tobacco Use  . Smoking status: Former Smoker    Packs/day: 0.50    Types: Cigarettes  . Smokeless tobacco: Never Used  Substance Use Topics  . Alcohol use: Yes    Comment: social  . Drug use: No     Allergies   Patient has no known allergies.   Review of Systems Review of Systems  Constitutional: Positive for activity change, appetite change and fever. Negative for chills.  HENT: Positive for congestion, rhinorrhea and sinus pain. Negative for ear pain and sore throat.   Respiratory: Positive for cough and wheezing. Negative for shortness of breath.   Cardiovascular: Negative for chest pain, palpitations and leg swelling.  Gastrointestinal: Positive for nausea and vomiting. Negative for abdominal pain and diarrhea.  Genitourinary: Negative for dysuria, flank pain, frequency, hematuria and urgency.  Musculoskeletal: Positive for myalgias.  Skin: Negative for rash.  Neurological: Positive for headaches.     Physical Exam Updated Vital Signs BP 116/70 (BP Location: Left Arm)   Pulse 86   Temp 98.6 F (37 C) (Oral)   Resp 16   Ht 5\' 6"  (1.676 m)   Wt 54.4 kg (120 lb)   LMP 03/05/2017   SpO2 100%   BMI 19.37 kg/m   Physical Exam  Constitutional: She  is oriented to person, place, and time. She appears well-developed and well-nourished.  Non-toxic appearance. No distress.  HENT:  Head: Normocephalic and atraumatic.  Right Ear: Tympanic membrane, external ear and ear canal normal.  Left Ear: Tympanic membrane, external ear and ear canal normal.  Nose: Mucosal edema and rhinorrhea present.  Mouth/Throat: Uvula is midline, oropharynx is clear and moist and mucous membranes are normal. No trismus in the jaw. No uvula swelling. No tonsillar exudate.  Eyes: Conjunctivae are normal. Pupils are equal, round, and reactive to light. Right eye exhibits no discharge. Left eye exhibits no discharge.  Neck: Normal range of motion. Neck supple.  No c spine midline tenderness. No paraspinal tenderness. No deformities or step offs noted. Full ROM. Supple. No nuchal rigidity.    Cardiovascular: Normal rate, regular rhythm, normal heart sounds and intact distal pulses. Exam reveals no gallop and no friction rub.  No murmur heard. Pulmonary/Chest: Effort normal. No stridor. No respiratory distress. She has wheezes (expiratory). She has no rales. She exhibits no tenderness.  Abdominal: Soft. Bowel sounds are normal. There is no tenderness. There is no rigidity, no rebound, no guarding, no CVA tenderness, no tenderness at McBurney's point and negative Murphy's sign.  Musculoskeletal: Normal range of motion. She exhibits no tenderness.  Lymphadenopathy:    She has no cervical adenopathy.  Neurological: She is alert and oriented to person, place, and time.  Skin: Skin is warm and dry. Capillary refill takes less than 2 seconds.  Psychiatric: Her behavior is normal. Judgment and thought content normal.  Nursing note and vitals reviewed.    ED Treatments / Results  Labs (all labs ordered are listed, but only abnormal results are displayed) Labs Reviewed  URINALYSIS, ROUTINE W REFLEX MICROSCOPIC - Abnormal; Notable for the following components:      Result  Value   APPearance HAZY (*)    All other components within normal limits  PREGNANCY, URINE    EKG  EKG Interpretation None       Radiology Dg Chest 2 View  Result Date: 03/26/2017 CLINICAL DATA:  Cough and fever for 10 days. EXAM: CHEST  2 VIEW COMPARISON:  04/23/2014 chest radiograph FINDINGS: The cardiomediastinal silhouette is unremarkable. There is no evidence of focal airspace disease, pulmonary edema, suspicious pulmonary nodule/mass, pleural effusion, or pneumothorax. No acute bony abnormalities are identified. IMPRESSION: No active cardiopulmonary disease. Electronically Signed   By: Harmon PierJeffrey  Hu M.D.   On: 03/26/2017 14:44    Procedures Procedures (including critical care time)  Medications Ordered in ED Medications  ondansetron (ZOFRAN-ODT) disintegrating tablet 4 mg (4 mg Oral Given 03/26/17 1101)  acetaminophen (TYLENOL) tablet 650 mg (650 mg Oral Given 03/26/17 1100)  predniSONE (DELTASONE) tablet 40 mg (40 mg Oral Given 03/26/17 1442)  ipratropium-albuterol (DUONEB) 0.5-2.5 (3) MG/3ML nebulizer solution 3 mL (3 mLs Nebulization Given 03/26/17 1447)     Initial Impression / Assessment and Plan / ED Course  I have reviewed the triage  vital signs and the nursing notes.  Pertinent labs & imaging results that were available during my care of the patient were reviewed by me and considered in my medical decision making (see chart for details).     Patient presents to the ED for evaluation of cough, generalized body aches, fevers, chills, posttussive emesis, rhinorrhea.  Cough started 1/2 weeks ago.  She also reports that yesterday she developed the body aches, chills and posttussive emesis.  Denies any associated abdominal pain, urinary symptoms, vaginal symptoms, chest pain, shortness of breath.  On exam patient is overall well-appearing and nontoxic.  She is afebrile in the ED.  No hypoxia, tachycardia, tachypnea or hypotension noted.  Patient does have some expiratory  wheezes noted on my exam.  Heart regular rate and rhythm.  No lower.  No abdominal tenderness to palpation.  No CVA tenderness.  No nuchal rigidity.  Urine pregnancy test was negative and was ordered at the request of patient.  UA does not show any signs of infection.  Doubt UTI or pyelonephritis.  Patient has no focal abdominal tenderness to palpation I do not feel that symptoms are from intra-abdominal etiology.  He does have some expiratory wheezes noted on exam.  This cleared with DuoNeb.  Chest x-ray shows no signs of focal infiltrate.    Patient symptoms seem consistent with a viral bronchitis.  She feels much improved after breathing treatment.  She is tolerating p.o. fluids in the ED without any emesis.  Discussed the likelihood of influenza with patient instructed her to drink plenty of fluids and take Motrin and Tylenol for fever and pain. Tamiflu not indicated. We will give her albuterol inhaler, Presas course of steroids and cough medication for home use.  Pt is hemodynamically stable, in NAD, & able to ambulate in the ED. Evaluation does not show pathology that would require ongoing emergent intervention or inpatient treatment. I explained the diagnosis to the patient. Pain has been managed & has no complaints prior to dc. Pt is comfortable with above plan and is stable for discharge at this time. All questions were answered prior to disposition. Strict return precautions for f/u to the ED were discussed. Encouraged follow up with PCP.  Final Clinical Impressions(s) / ED Diagnoses   Final diagnoses:  Influenza-like illness  Cough    ED Discharge Orders        Ordered    albuterol (PROVENTIL HFA;VENTOLIN HFA) 108 (90 Base) MCG/ACT inhaler  Every 6 hours PRN     03/26/17 1502    benzonatate (TESSALON) 100 MG capsule  Every 8 hours     03/26/17 1502    predniSONE (DELTASONE) 20 MG tablet  Daily with breakfast     03/26/17 1502       Wallace Keller 03/26/17 1633      Azalia Bilis, MD 03/26/17 2230

## 2018-03-20 ENCOUNTER — Encounter (HOSPITAL_BASED_OUTPATIENT_CLINIC_OR_DEPARTMENT_OTHER): Payer: Self-pay | Admitting: Emergency Medicine

## 2018-03-20 ENCOUNTER — Other Ambulatory Visit: Payer: Self-pay

## 2018-03-20 ENCOUNTER — Emergency Department (HOSPITAL_BASED_OUTPATIENT_CLINIC_OR_DEPARTMENT_OTHER)
Admission: EM | Admit: 2018-03-20 | Discharge: 2018-03-20 | Disposition: A | Discharge status: Home / Self Care | Payer: Self-pay | Attending: Emergency Medicine | Admitting: Emergency Medicine

## 2018-03-20 ENCOUNTER — Emergency Department (HOSPITAL_BASED_OUTPATIENT_CLINIC_OR_DEPARTMENT_OTHER): Payer: Self-pay

## 2018-03-20 DIAGNOSIS — Y990 Civilian activity done for income or pay: Secondary | ICD-10-CM | POA: Insufficient documentation

## 2018-03-20 DIAGNOSIS — S39012A Strain of muscle, fascia and tendon of lower back, initial encounter: Secondary | ICD-10-CM | POA: Insufficient documentation

## 2018-03-20 DIAGNOSIS — Z87891 Personal history of nicotine dependence: Secondary | ICD-10-CM | POA: Insufficient documentation

## 2018-03-20 DIAGNOSIS — Y929 Unspecified place or not applicable: Secondary | ICD-10-CM | POA: Insufficient documentation

## 2018-03-20 DIAGNOSIS — Y939 Activity, unspecified: Secondary | ICD-10-CM | POA: Insufficient documentation

## 2018-03-20 DIAGNOSIS — X509XXA Other and unspecified overexertion or strenuous movements or postures, initial encounter: Secondary | ICD-10-CM | POA: Insufficient documentation

## 2018-03-20 DIAGNOSIS — Z79899 Other long term (current) drug therapy: Secondary | ICD-10-CM | POA: Insufficient documentation

## 2018-03-20 LAB — PREGNANCY, URINE: PREG TEST UR: NEGATIVE

## 2018-03-20 MED ORDER — CYCLOBENZAPRINE HCL 10 MG PO TABS: 10.0000 mg | ORAL_TABLET | Freq: Three times a day (TID) | ORAL | 0 refills | Status: DC | PRN

## 2018-03-20 MED ORDER — IBUPROFEN 800 MG PO TABS: 800.0000 mg | ORAL_TABLET | Freq: Three times a day (TID) | ORAL | 0 refills | Status: DC

## 2018-03-20 MED ORDER — KETOROLAC TROMETHAMINE 30 MG/ML IJ SOLN
30.0000 mg | Freq: Once | INTRAMUSCULAR | Status: AC
  Administered 2018-03-20: 30 mg via INTRAMUSCULAR
  Filled 2018-03-20: qty 1

## 2018-03-20 MED FILL — CYCLOBENZAPRINE HCL 10 MG T: 10 | 3 days supply | Qty: 8 | Fill #0

## 2018-03-20 MED FILL — IBUPROFEN 800 MG TAB: 800 | 4 days supply | Qty: 12 | Fill #0

## 2018-03-20 NOTE — ED Provider Notes (Signed)
[facility] EMERGENCY DEPARTMENT Provider Note   CSN: [id number] Arrival date & time: 03/20/18  8676     History   Chief Complaint Chief Complaint  Patient presents with  . Back Pain    HPI Heidi Bowman is a 28 y.o. female.  28yo F w/ PMH below who p/w back pain.  Yesterday she was at work and tried to lift a heavy box.  She felt a pain in her back and went to put the box down and she suddenly felt a pop and had severe back pain.  Pain is midline to right sided and goes from her lower thoracic down to her lumbar spine.  Pain is severe and worse with certain movements.  She tried managing symptoms at home including taking Advil yesterday but today her pain has been severe.  She is able to walk and denies any leg weakness or numbness.  No bowel/bladder incontinence or saddle anesthesia.  No fevers or recent illness.  No IV drug use.  No history of back injury.  The history is provided by the patient.  Back Pain    Past Medical History:  Diagnosis Date  . Anxiety   . Kidney problem   . Recurrent UTI     There are no active problems to display for this patient.   History reviewed. No pertinent surgical history.   OB History   No obstetric history on file.      Home Medications    Prior to Admission medications   Medication Sig Start Date End Date Taking? Authorizing Provider  albuterol (PROVENTIL HFA;VENTOLIN HFA) 108 (90 Base) MCG/ACT inhaler Inhale 1-2 puffs into the lungs every 6 (six) hours as needed for wheezing or shortness of breath. 03/26/17   Rise Mu, PA-C  benzonatate (TESSALON) 100 MG capsule Take 1 capsule (100 mg total) by mouth every 8 (eight) hours. 03/26/17   Rise Mu, PA-C  cyclobenzaprine (FLEXERIL) 10 MG tablet Take 1 tablet (10 mg total) by mouth 3 (three) times daily as needed for muscle spasms. 03/20/18   Heidi Bowman, Ambrose Finland, MD  ibuprofen (ADVIL,MOTRIN) 800 MG tablet Take 1 tablet (800 mg total) by mouth 3 (three)  times daily. 03/20/18   Heidi Bowman, Ambrose Finland, MD  LORazepam (ATIVAN) 1 MG tablet Take 1 tablet (1 mg total) by mouth 2 (two) times daily as needed for anxiety. 03/26/14   Lurene Shadow, PA-C  predniSONE (DELTASONE) 20 MG tablet Take 2 tablets (40 mg total) by mouth daily with breakfast. 03/26/17   Rise Mu, PA-C  sertraline (ZOLOFT) 50 MG tablet Take 1 tablet (50 mg total) by mouth daily. 03/29/14   Lattie Haw, MD    Family History No family history on file.  Social History Social History   Tobacco Use  . Smoking status: Former Smoker    Packs/day: 0.50    Types: Cigarettes  . Smokeless tobacco: Never Used  Substance Use Topics  . Alcohol use: Yes    Comment: social  . Drug use: No     Allergies   Patient has no known allergies.   Review of Systems Review of Systems  Musculoskeletal: Positive for back pain.   All other systems reviewed and are negative except that which was mentioned in HPI   Physical Exam Updated Vital Signs BP 137/71   Pulse 96   Temp 98.7 F (37.1 C) (Oral)   Resp 18   Ht 5\' 6"  (1.676 m)   Wt 59 kg  LMP 02/19/2018   SpO2 100%   BMI 20.98 kg/m   Physical Exam Vitals signs and nursing note reviewed.  Constitutional:      Appearance: She is well-developed.     Comments: Tearful due to pain  HENT:     Head: Normocephalic and atraumatic.     Mouth/Throat:     Mouth: Mucous membranes are moist.  Eyes:     Conjunctiva/sclera: Conjunctivae normal.  Neck:     Musculoskeletal: Neck supple.  Cardiovascular:     Rate and Rhythm: Normal rate and regular rhythm.     Heart sounds: Normal heart sounds. No murmur.  Pulmonary:     Effort: Pulmonary effort is normal.     Breath sounds: Normal breath sounds.  Abdominal:     General: Bowel sounds are normal. There is no distension.     Palpations: Abdomen is soft.     Tenderness: There is no abdominal tenderness.  Musculoskeletal:     Comments: Tenderness of lower thoracic spine  down to upper lumbar spine including R paraspinal muscles; no stepoff  Skin:    General: Skin is warm and dry.  Neurological:     Mental Status: She is alert and oriented to person, place, and time.     Sensory: No sensory deficit.     Motor: No weakness.     Gait: Gait normal.     Comments: Fluent speech, normal gait  Psychiatric:        Judgment: Judgment normal.      ED Treatments / Results  Labs (all labs ordered are listed, but only abnormal results are displayed) Labs Reviewed  PREGNANCY, URINE    EKG None  Radiology Dg Thoracic Spine 2 View  Result Date: 03/20/2018 CLINICAL DATA:  Thoracic and low back pain without radicular complaints. Felt a pop after heavy lifting yesterday. EXAM: THORACIC SPINE 2 VIEWS COMPARISON:  Chest radiographs 03/26/2017 FINDINGS: There are 12 full sized pairs of ribs. Vertebral alignment is normal. No fracture is identified. Disc space heights are preserved without significant degenerative changes. The lungs are grossly clear. IMPRESSION: Negative. Electronically Signed   By: Sebastian AcheAllen  Grady M.D.   On: 03/20/2018 11:38   Dg Lumbar Spine Complete  Result Date: 03/20/2018 CLINICAL DATA:  Thoracic and low back pain without radicular complaints. Felt a pop after heavy lifting yesterday. EXAM: LUMBAR SPINE - COMPLETE 4+ VIEW COMPARISON:  None. FINDINGS: There are 5 non rib bearing lumbar type vertebrae. Vertebral alignment is normal. No fracture is identified. Intervertebral disc space heights are preserved without significant degenerative changes. IMPRESSION: Negative. Electronically Signed   By: Sebastian AcheAllen  Grady M.D.   On: 03/20/2018 11:39    Procedures Procedures (including critical care time)  Medications Ordered in ED Medications  ketorolac (TORADOL) 30 MG/ML injection 30 mg (30 mg Intramuscular Given 03/20/18 1129)     Initial Impression / Assessment and Plan / ED Course  I have reviewed the triage vital signs and the nursing  notes.  Pertinent labs & imaging results that were available during my care of the patient were reviewed by me and considered in my medical decision making (see chart for details).     Because of pop sensation, obtained XR to r/o bony injury. XR T and L spine negative. Suspect muscle strain. No signs/sx of cauda equina or infectious process. Gave IM toradol, gust supportive measures including continued NSAIDs on a schedule for 3 to 4 days, muscle relaxants and heat therapy as needed, follow-up with PCP  for reassessment.  Extensively reviewed return precautions including any neurologic symptoms.  She voiced understanding.  Final Clinical Impressions(s) / ED Diagnoses   Final diagnoses:  Back strain, initial encounter    ED Discharge Orders         Ordered    ibuprofen (ADVIL,MOTRIN) 800 MG tablet  3 times daily     03/20/18 1226    cyclobenzaprine (FLEXERIL) 10 MG tablet  3 times daily PRN     03/20/18 1226           Dietrick Barris, Ambrose Finland, MD 03/20/18 1240

## 2018-03-20 NOTE — ED Triage Notes (Signed)
Pt reports lower back pain after lifting heavy object yesterday

## 2018-05-16 ENCOUNTER — Encounter (HOSPITAL_BASED_OUTPATIENT_CLINIC_OR_DEPARTMENT_OTHER): Payer: Self-pay | Admitting: Adult Health

## 2018-05-16 ENCOUNTER — Other Ambulatory Visit: Payer: Self-pay

## 2018-05-16 ENCOUNTER — Emergency Department (HOSPITAL_BASED_OUTPATIENT_CLINIC_OR_DEPARTMENT_OTHER)
Admission: EM | Admit: 2018-05-16 | Discharge: 2018-05-16 | Disposition: A | Discharge status: Home / Self Care | Payer: Self-pay | Attending: Emergency Medicine | Admitting: Emergency Medicine

## 2018-05-16 DIAGNOSIS — J069 Acute upper respiratory infection, unspecified: Secondary | ICD-10-CM | POA: Insufficient documentation

## 2018-05-16 DIAGNOSIS — Z79899 Other long term (current) drug therapy: Secondary | ICD-10-CM | POA: Insufficient documentation

## 2018-05-16 DIAGNOSIS — Z87891 Personal history of nicotine dependence: Secondary | ICD-10-CM | POA: Insufficient documentation

## 2018-05-16 NOTE — ED Provider Notes (Signed)
[facility] EMERGENCY DEPARTMENT Provider Note   CSN: [id number] Arrival date & time: 05/16/18  7829    History   Chief Complaint Chief Complaint  Patient presents with  . Fever    HPI Heidi Bowman is a 28 y.o. female.     28 yo F with a chief complaint of sinus congestion.  This been going on since yesterday evening.  She has had a mild cough with this.  Denies ear pain denies sore throat.  She has been having some subjective fevers and measured temperature at home less than 101.  She had one episode of vomiting yesterday.  Has been able to tolerate p.o. without difficulty.  Traveled about 2 weeks ago to [place].  Denies other travel.  Denies known sick contacts.  She works as a Merchandiser, retail at [employer] and was sent here by her work to ascertain when she can return.  The history is provided by the patient.  Fever  Associated symptoms: congestion and cough   Associated symptoms: no chest pain, no chills, no dysuria, no headaches, no myalgias, no nausea, no rhinorrhea and no vomiting   Illness  Severity:  Moderate Onset quality:  Sudden Duration:  2 days Timing:  Constant Progression:  Worsening Chronicity:  New Associated symptoms: congestion, cough and fever   Associated symptoms: no chest pain, no headaches, no myalgias, no nausea, no rhinorrhea, no shortness of breath, no vomiting and no wheezing     Past Medical History:  Diagnosis Date  . Anxiety   . Kidney problem   . Recurrent UTI     There are no active problems to display for this patient.   History reviewed. No pertinent surgical history.   OB History   No obstetric history on file.      Home Medications    Prior to Admission medications   Medication Sig Start Date End Date Taking? Authorizing Provider  albuterol (PROVENTIL HFA;VENTOLIN HFA) 108 (90 Base) MCG/ACT inhaler Inhale 1-2 puffs into the lungs every 6 (six) hours as needed for wheezing or shortness of breath. 03/26/17   Rise Mu, PA-C  benzonatate (TESSALON) 100 MG capsule Take 1 capsule (100 mg total) by mouth every 8 (eight) hours. 03/26/17   Rise Mu, PA-C  cyclobenzaprine (FLEXERIL) 10 MG tablet Take 1 tablet (10 mg total) by mouth 3 (three) times daily as needed for muscle spasms. 03/20/18   Little, Ambrose Finland, MD  ibuprofen (ADVIL,MOTRIN) 800 MG tablet Take 1 tablet (800 mg total) by mouth 3 (three) times daily. 03/20/18   Little, Ambrose Finland, MD  LORazepam (ATIVAN) 1 MG tablet Take 1 tablet (1 mg total) by mouth 2 (two) times daily as needed for anxiety. 03/26/14   Lurene Shadow, PA-C  predniSONE (DELTASONE) 20 MG tablet Take 2 tablets (40 mg total) by mouth daily with breakfast. 03/26/17   Rise Mu, PA-C  sertraline (ZOLOFT) 50 MG tablet Take 1 tablet (50 mg total) by mouth daily. 03/29/14   Lattie Haw, MD    Family History History reviewed. No pertinent family history.  Social History Social History   Tobacco Use  . Smoking status: Former Smoker    Packs/day: 0.50    Types: Cigarettes  . Smokeless tobacco: Never Used  Substance Use Topics  . Alcohol use: Yes    Comment: social  . Drug use: No     Allergies   Patient has no known allergies.   Review of Systems Review of Systems  Constitutional: Positive for fever. Negative for chills.  HENT: Positive for congestion. Negative for rhinorrhea.   Eyes: Negative for redness and visual disturbance.  Respiratory: Positive for cough. Negative for shortness of breath and wheezing.   Cardiovascular: Negative for chest pain and palpitations.  Gastrointestinal: Negative for nausea and vomiting.  Genitourinary: Negative for dysuria and urgency.  Musculoskeletal: Negative for arthralgias and myalgias.  Skin: Negative for pallor and wound.  Neurological: Negative for dizziness and headaches.     Physical Exam Updated Vital Signs BP (!) 151/96   Pulse (!) 106   Temp 98.6 F (37 C) (Oral)   Resp 18   Ht 5\' 6"   (1.676 m)   Wt 56.7 kg   LMP 04/18/2018   SpO2 100%   BMI 20.18 kg/m   Physical Exam Vitals signs and nursing note reviewed.  Constitutional:      General: She is not in acute distress.    Appearance: She is well-developed. She is not diaphoretic.  HENT:     Head: Normocephalic and atraumatic.     Comments: Swollen turbinates, posterior nasal drip, no noted sinus ttp, tm normal bilaterally.   Eyes:     Pupils: Pupils are equal, round, and reactive to light.  Neck:     Musculoskeletal: Normal range of motion and neck supple.  Cardiovascular:     Rate and Rhythm: Normal rate and regular rhythm.     Heart sounds: No murmur. No friction rub. No gallop.   Pulmonary:     Effort: Pulmonary effort is normal.     Breath sounds: No wheezing or rales.  Abdominal:     General: There is no distension.     Palpations: Abdomen is soft.     Tenderness: There is no abdominal tenderness.  Musculoskeletal:        General: No tenderness.  Skin:    General: Skin is warm and dry.  Neurological:     Mental Status: She is alert and oriented to person, place, and time.  Psychiatric:        Behavior: Behavior normal.      ED Treatments / Results  Labs (all labs ordered are listed, but only abnormal results are displayed) Labs Reviewed - No data to display  EKG None  Radiology No results found.  Procedures Procedures (including critical care time)  Medications Ordered in ED Medications - No data to display   Initial Impression / Assessment and Plan / ED Course  I have reviewed the triage vital signs and the nursing notes.  Pertinent labs & imaging results that were available during my care of the patient were reviewed by me and considered in my medical decision making (see chart for details).        27 yo F here with URI symptoms.  Going on since yesterday.  Patient is well-appearing and nontoxic.  She has no lower respiratory symptoms her lungs are clear.  I doubt that she  has pneumonia or the novel coronavirus.  She does have significant sinus congestion but without fever severe sinus pain and only 2 days of symptoms according to the most recent IDSA guidelines we will hold off on antibiotic therapy.  Discussed different symptomatic therapy she can try at home.  PCP follow-up.  8:45 AM:  I have discussed the diagnosis/risks/treatment options with the patient and believe the pt to be eligible for discharge home to follow-up with PCP. We also discussed returning to the ED immediately if new or worsening sx  occur. We discussed the sx which are most concerning (e.g., sudden worsening pain, fever, inability to tolerate by mouth) that necessitate immediate return. Medications administered to the patient during their visit and any new prescriptions provided to the patient are listed below.  Medications given during this visit Medications - No data to display   The patient appears reasonably screen and/or stabilized for discharge and I doubt any other medical condition or other [facility] requiring further screening, evaluation, or treatment in the ED at this time prior to discharge.    Final Clinical Impressions(s) / ED Diagnoses   Final diagnoses:  Viral URI    ED Discharge Orders    None       Melene Plan, DO 05/16/18 [id number]

## 2018-05-16 NOTE — ED Triage Notes (Signed)
PResents with sinus congestion, occasional cough, runny nose and fever since Thursday. Wednesday she had a stomach ache and was vomiting, then the upper respiraoty symptoms began the next day. She visited and flew to [facility] March 8-13. Reports fevers of 99-100 at home and chills with bodyaches. Denies SOB and chest pain.

## 2018-05-16 NOTE — Discharge Instructions (Signed)
Take tylenol 2 pills 4 times a day and motrin 4 pills 3 times a day.  Drink plenty of fluids.  Return for worsening shortness of breath, headache, confusion. Follow up with your family doctor.  You can try Sudafed, the medication you have to buy with your driver's license.  This can make your blood pressure go up and so if you have a problem with your blood pressure evaluation not take this medicine.  Please follow-up with family physician.  As we discussed before return for sudden worsening if you feel better and then started having a fever and worsening symptoms.

## 2018-08-24 ENCOUNTER — Other Ambulatory Visit: Payer: Self-pay

## 2018-08-24 ENCOUNTER — Encounter (HOSPITAL_BASED_OUTPATIENT_CLINIC_OR_DEPARTMENT_OTHER): Payer: Self-pay | Admitting: Emergency Medicine

## 2018-08-24 ENCOUNTER — Emergency Department (HOSPITAL_BASED_OUTPATIENT_CLINIC_OR_DEPARTMENT_OTHER)
Admission: EM | Admit: 2018-08-24 | Discharge: 2018-08-24 | Disposition: A | Discharge status: Home / Self Care | Payer: Self-pay | Attending: Emergency Medicine | Admitting: Emergency Medicine

## 2018-08-24 DIAGNOSIS — Z20828 Contact with and (suspected) exposure to other viral communicable diseases: Secondary | ICD-10-CM | POA: Insufficient documentation

## 2018-08-24 DIAGNOSIS — Z87891 Personal history of nicotine dependence: Secondary | ICD-10-CM | POA: Insufficient documentation

## 2018-08-24 DIAGNOSIS — R112 Nausea with vomiting, unspecified: Secondary | ICD-10-CM | POA: Insufficient documentation

## 2018-08-24 LAB — LIPASE, BLOOD: Lipase: 21 U/L (ref 11–51)

## 2018-08-24 LAB — URINALYSIS, ROUTINE W REFLEX MICROSCOPIC
Bilirubin Urine: NEGATIVE
Glucose, UA: NEGATIVE mg/dL
Hgb urine dipstick: NEGATIVE
Ketones, ur: NEGATIVE mg/dL
Nitrite: NEGATIVE
Protein, ur: NEGATIVE mg/dL
Specific Gravity, Urine: 1.015 (ref 1.005–1.030)
pH: 6.5 (ref 5.0–8.0)

## 2018-08-24 LAB — COMPREHENSIVE METABOLIC PANEL
ALT: 10 U/L (ref 0–44)
AST: 14 U/L — ABNORMAL LOW (ref 15–41)
Albumin: 4.1 g/dL (ref 3.5–5.0)
Alkaline Phosphatase: 46 U/L (ref 38–126)
Anion gap: 10 (ref 5–15)
BUN: 9 mg/dL (ref 6–20)
CO2: 22 mmol/L (ref 22–32)
Calcium: 8.8 mg/dL — ABNORMAL LOW (ref 8.9–10.3)
Chloride: 101 mmol/L (ref 98–111)
Creatinine, Ser: 0.82 mg/dL (ref 0.44–1.00)
GFR calc Af Amer: >60 mL/min (ref >60)
GFR calc non Af Amer: >60 mL/min (ref >60)
Glucose, Bld: 103 mg/dL — ABNORMAL HIGH (ref 70–99)
Potassium: 3.9 mmol/L (ref 3.5–5.1)
Sodium: 133 mmol/L — ABNORMAL LOW (ref 135–145)
Total Bilirubin: 1 mg/dL (ref 0.3–1.2)
Total Protein: 7.2 g/dL (ref 6.5–8.1)

## 2018-08-24 LAB — CBC
HCT: 42 % (ref 36.0–46.0)
Hemoglobin: 13.9 g/dL (ref 12.0–15.0)
MCH: 29.6 pg (ref 26.0–34.0)
MCHC: 33.1 g/dL (ref 30.0–36.0)
MCV: 89.4 fL (ref 80.0–100.0)
Platelets: 166 10*3/uL (ref 150–400)
RBC: 4.7 MIL/uL (ref 3.87–5.11)
RDW: 12.1 % (ref 11.5–15.5)
WBC: 14.1 10*3/uL — ABNORMAL HIGH (ref 4.0–10.5)
nRBC: 0 % (ref 0.0–0.2)

## 2018-08-24 LAB — URINALYSIS, MICROSCOPIC (REFLEX)

## 2018-08-24 LAB — PREGNANCY, URINE: Preg Test, Ur: NEGATIVE

## 2018-08-24 MED ORDER — ONDANSETRON 4 MG PO TBDP
4.0000 mg | ORAL_TABLET | Freq: Once | ORAL | Status: AC
  Administered 2018-08-24: 4 mg via ORAL
  Filled 2018-08-24: qty 1

## 2018-08-24 MED ORDER — SODIUM CHLORIDE 0.9 % IV BOLUS
1000.0000 mL | Freq: Once | INTRAVENOUS | Status: AC
  Administered 2018-08-24: 1000 mL via INTRAVENOUS

## 2018-08-24 MED ORDER — ONDANSETRON HCL 4 MG/2ML IJ SOLN
4.0000 mg | Freq: Once | INTRAMUSCULAR | Status: AC
  Administered 2018-08-24: 4 mg via INTRAVENOUS
  Filled 2018-08-24: qty 2

## 2018-08-24 MED ORDER — ONDANSETRON 8 MG PO TBDP: 8.0000 mg | ORAL_TABLET | Freq: Three times a day (TID) | ORAL | 0 refills | Status: DC | PRN

## 2018-08-24 NOTE — ED Triage Notes (Signed)
Pt said this morning around 0100 she was awaken by body aches and then has been vomiting. States 5 episodes of vomiting aprrox. Had feelings of malaise yesterday

## 2018-08-24 NOTE — Discharge Instructions (Signed)
You can access your results to your COVID test via "MyChart"  If you are + for COVID-19 infection please contact your work place [employer] [facility]  Please self quarantine at home

## 2018-08-24 NOTE — ED Provider Notes (Signed)
[facility] EMERGENCY DEPARTMENT Provider Note   CSN: [id number] Arrival date & time: 08/24/18  [id number]     History   Chief Complaint Chief Complaint  Patient presents with  . Generalized Body Aches  . Emesis    HPI Heidi Cageyshley Seelman is a 28 y.o. female.     HPI Patient is a 28 year old female presents the emergency department with complaints of myalgias nausea vomiting since last night.  She works as a Production designer, theatre/television/filmmanager at [employer].  She has no known sick contacts with COVID-19.  She denies cough or shortness of breath.  She denies significant abdominal pain.  No dysuria or urinary frequency.  No fevers or chills.  No back or flank pain.  She just states that she feels weak.  She attempted to go to work this morning but felt too poorly.  No blood in her vomit.  No diarrhea  Past Medical History:  Diagnosis Date  . Anxiety   . Kidney problem   . Recurrent UTI     There are no active problems to display for this patient.   History reviewed. No pertinent surgical history.   OB History   No obstetric history on file.      Home Medications    Prior to Admission medications   Medication Sig Start Date End Date Taking? Authorizing Provider  ondansetron (ZOFRAN ODT) 8 MG disintegrating tablet Take 1 tablet (8 mg total) by mouth every 8 (eight) hours as needed for nausea or vomiting. 08/24/18   Azalia Bilisampos, Chaquita Basques, MD  albuterol (PROVENTIL HFA;VENTOLIN HFA) 108 (90 Base) MCG/ACT inhaler Inhale 1-2 puffs into the lungs every 6 (six) hours as needed for wheezing or shortness of breath. 03/26/17 08/24/18  Rise MuLeaphart, Kenneth T, PA-C  sertraline (ZOLOFT) 50 MG tablet Take 1 tablet (50 mg total) by mouth daily. 03/29/14 08/24/18  Lattie HawBeese, Stephen A, MD    Family History No family history on file.  Social History Social History   Tobacco Use  . Smoking status: Former Smoker    Packs/day: 0.50    Types: Cigarettes  . Smokeless tobacco: Never Used  Substance Use Topics  . Alcohol use: Yes   Comment: social  . Drug use: No     Allergies   Patient has no known allergies.   Review of Systems Review of Systems  All other systems reviewed and are negative.    Physical Exam Updated Vital Signs BP (!) 154/100   Pulse 97   Temp 99.3 F (37.4 C) (Oral)   Resp (!) 22   Ht 5\' 7"  (1.702 m)   Wt 61.2 kg   LMP 08/04/2018   SpO2 100%   BMI 21.14 kg/m   Physical Exam Vitals signs and nursing note reviewed.  Constitutional:      General: She is not in acute distress.    Appearance: She is well-developed.  HENT:     Head: Normocephalic and atraumatic.  Neck:     Musculoskeletal: Normal range of motion.  Cardiovascular:     Rate and Rhythm: Normal rate and regular rhythm.     Heart sounds: Normal heart sounds.  Pulmonary:     Effort: Pulmonary effort is normal.     Breath sounds: Normal breath sounds.  Abdominal:     General: There is no distension.     Palpations: Abdomen is soft.     Tenderness: There is no abdominal tenderness.  Musculoskeletal: Normal range of motion.  Skin:    General: Skin is warm and  dry.  Neurological:     Mental Status: She is alert and oriented to person, place, and time.  Psychiatric:        Judgment: Judgment normal.      ED Treatments / Results  Labs (all labs ordered are listed, but only abnormal results are displayed) Labs Reviewed  URINALYSIS, ROUTINE W REFLEX MICROSCOPIC - Abnormal; Notable for the following components:      Result Value   Leukocytes,Ua TRACE (*)    All other components within normal limits  CBC - Abnormal; Notable for the following components:   WBC 14.1 (*)    All other components within normal limits  COMPREHENSIVE METABOLIC PANEL - Abnormal; Notable for the following components:   Sodium 133 (*)    Glucose, Bld 103 (*)    Calcium 8.8 (*)    AST 14 (*)    All other components within normal limits  URINALYSIS, MICROSCOPIC (REFLEX) - Abnormal; Notable for the following components:   Bacteria,  UA MANY (*)    All other components within normal limits  NOVEL CORONAVIRUS, NAA (HOSPITAL ORDER, SEND-OUT TO REF LAB)  PREGNANCY, URINE  LIPASE, BLOOD    EKG None  Radiology No results found.  Procedures Procedures (including critical care time)  Medications Ordered in ED Medications  ondansetron (ZOFRAN-ODT) disintegrating tablet 4 mg (4 mg Oral Given 08/24/18 0707)  sodium chloride 0.9 % bolus 1,000 mL (1,000 mLs Intravenous New Bag/Given 08/24/18 0727)  ondansetron (ZOFRAN) injection 4 mg (4 mg Intravenous Given 08/24/18 0726)     Initial Impression / Assessment and Plan / ED Course  I have reviewed the triage vital signs and the nursing notes.  Pertinent labs & imaging results that were available during my care of the patient were reviewed by me and considered in my medical decision making (see chart for details).        Abdominal exam is without focal tenderness.  Likely viral illness.  Given current pandemic patient will be checked for COVID-19.  Standard home quarantine and COVID-19 precautions given.  Send out test   Heidi Bowman was evaluated in Emergency Department on 08/24/2018 for the symptoms described in the history of present illness. She was evaluated in the context of the global COVID-19 pandemic, which necessitated consideration that the patient might be at risk for infection with the SARS-CoV-2 virus that causes COVID-19. Institutional protocols and algorithms that pertain to the evaluation of patients at risk for COVID-19 are in a state of rapid change based on information released by regulatory bodies including the CDC and federal and state organizations. These policies and algorithms were followed during the patient's care in the ED.   Final Clinical Impressions(s) / ED Diagnoses   Final diagnoses:  Nausea and vomiting, intractability of vomiting not specified, unspecified vomiting type    ED Discharge Orders         Ordered    ondansetron (ZOFRAN ODT) 8  MG disintegrating tablet  Every 8 hours PRN     08/24/18 0804           Jola Schmidt, MD 08/24/18 [phone]

## 2018-08-25 LAB — NOVEL CORONAVIRUS, NAA (HOSP ORDER, SEND-OUT TO REF LAB; TAT 18-24 HRS): SARS-CoV-2, NAA: NOT DETECTED

## 2018-08-26 ENCOUNTER — Encounter (HOSPITAL_BASED_OUTPATIENT_CLINIC_OR_DEPARTMENT_OTHER): Payer: Self-pay | Admitting: Emergency Medicine

## 2018-08-26 ENCOUNTER — Emergency Department (HOSPITAL_BASED_OUTPATIENT_CLINIC_OR_DEPARTMENT_OTHER)
Admission: EM | Admit: 2018-08-26 | Discharge: 2018-08-26 | Disposition: A | Discharge status: Home / Self Care | Payer: Self-pay | Attending: Emergency Medicine | Admitting: Emergency Medicine

## 2018-08-26 ENCOUNTER — Other Ambulatory Visit: Payer: Self-pay

## 2018-08-26 DIAGNOSIS — M791 Myalgia, unspecified site: Secondary | ICD-10-CM | POA: Insufficient documentation

## 2018-08-26 DIAGNOSIS — R52 Pain, unspecified: Secondary | ICD-10-CM

## 2018-08-26 DIAGNOSIS — Z87891 Personal history of nicotine dependence: Secondary | ICD-10-CM | POA: Insufficient documentation

## 2018-08-26 DIAGNOSIS — N3091 Cystitis, unspecified with hematuria: Secondary | ICD-10-CM | POA: Insufficient documentation

## 2018-08-26 DIAGNOSIS — N3001 Acute cystitis with hematuria: Secondary | ICD-10-CM

## 2018-08-26 DIAGNOSIS — R110 Nausea: Secondary | ICD-10-CM | POA: Insufficient documentation

## 2018-08-26 LAB — URINALYSIS, ROUTINE W REFLEX MICROSCOPIC
Bilirubin Urine: NEGATIVE
Glucose, UA: NEGATIVE mg/dL
Ketones, ur: NEGATIVE mg/dL
Nitrite: NEGATIVE
Protein, ur: NEGATIVE mg/dL
Specific Gravity, Urine: 1.02 (ref 1.005–1.030)
pH: 6.5 (ref 5.0–8.0)

## 2018-08-26 LAB — URINALYSIS, MICROSCOPIC (REFLEX): WBC, UA: >50 WBC/hpf (ref 0–5)

## 2018-08-26 MED ORDER — CEPHALEXIN 500 MG PO CAPS: 500.0000 mg | ORAL_CAPSULE | Freq: Two times a day (BID) | ORAL | 0 refills | Status: DC

## 2018-08-26 MED ORDER — PHENAZOPYRIDINE HCL 200 MG PO TABS: 200.0000 mg | ORAL_TABLET | Freq: Three times a day (TID) | ORAL | 0 refills | Status: DC | PRN

## 2018-08-26 MED ORDER — ONDANSETRON 4 MG PO TBDP: 4.0000 mg | ORAL_TABLET | Freq: Three times a day (TID) | ORAL | 0 refills | Status: DC | PRN

## 2018-08-26 MED FILL — ONDANSETRON ODT 4 MG TABLET: 4 | 2 days supply | Qty: 6 | Fill #0

## 2018-08-26 MED FILL — CEPHALEXIN 500 MG CAPSULE: 500 | 7 days supply | Qty: 14 | Fill #0

## 2018-08-26 MED FILL — PHENAZOPYRIDINE 200 MG TAB: 200 | 2 days supply | Qty: 6 | Fill #0

## 2018-08-26 NOTE — ED Provider Notes (Signed)
[facility] EMERGENCY DEPARTMENT Provider Note   CSN: [id number] Arrival date & time: 08/26/18  [id number]     History   Chief Complaint Chief Complaint  Patient presents with   Dysuria   Generalized Body Aches    HPI Heidi Bowman is a 28 y.o. female.     28 year old female with past medical history including anxiety and UTI who presents with dysuria and body aches.  Patient reports several days of ongoing pain with urination, suprapubic pain when urinating, and urinary frequency.  She has had associated body aches and nausea and vomiting.  No vomiting today.  She was evaluated 2 days ago and had lab work including COVID testing which was negative.  She was discharged home with supportive measures but she states that her symptoms have been persistent.  She denies any cough/cold symptoms or sick contacts.  She states that she normally gets body aches and feels like this when she has a urinary tract infection.  She has been taking Tylenol on a schedule without relief.  No vaginal bleeding or discharge.  The history is provided by the patient.  Dysuria   Past Medical History:  Diagnosis Date   Anxiety    Kidney problem    Recurrent UTI     There are no active problems to display for this patient.   History reviewed. No pertinent surgical history.   OB History   No obstetric history on file.      Home Medications    Prior to Admission medications   Medication Sig Start Date End Date Taking? Authorizing Provider  cephALEXin (KEFLEX) 500 MG capsule Take 1 capsule (500 mg total) by mouth 2 (two) times daily. 08/26/18   Caela Huot, Ambrose Finlandachel Morgan, MD  ondansetron (ZOFRAN ODT) 4 MG disintegrating tablet Take 1 tablet (4 mg total) by mouth every 8 (eight) hours as needed for nausea or vomiting. 08/26/18   Farron Watrous, Ambrose Finlandachel Morgan, MD  phenazopyridine (PYRIDIUM) 200 MG tablet Take 1 tablet (200 mg total) by mouth 3 (three) times daily as needed for pain. 08/26/18   Karlina Suares, Ambrose Finlandachel  Morgan, MD  albuterol (PROVENTIL HFA;VENTOLIN HFA) 108 (90 Base) MCG/ACT inhaler Inhale 1-2 puffs into the lungs every 6 (six) hours as needed for wheezing or shortness of breath. 03/26/17 08/24/18  Rise MuLeaphart, Kenneth T, PA-C  sertraline (ZOLOFT) 50 MG tablet Take 1 tablet (50 mg total) by mouth daily. 03/29/14 08/24/18  Lattie HawBeese, Stephen A, MD    Family History No family history on file.  Social History Social History   Tobacco Use   Smoking status: Former Smoker    Packs/day: 0.50    Types: Cigarettes   Smokeless tobacco: Never Used  Substance Use Topics   Alcohol use: Yes    Comment: social   Drug use: No     Allergies   Patient has no known allergies.   Review of Systems Review of Systems  Genitourinary: Positive for dysuria.   All other systems reviewed and are negative except that which was mentioned in HPI   Physical Exam Updated Vital Signs BP 132/73 (BP Location: Right Arm)    Pulse 84    Temp 99 F (37.2 C) (Oral)    Resp 16    Ht 5\' 7"  (1.702 m)    Wt 59 kg    LMP 08/04/2018    SpO2 100%    BMI 20.36 kg/m   Physical Exam Vitals signs and nursing note reviewed.  Constitutional:      General:  She is not in acute distress.    Appearance: She is well-developed.  HENT:     Head: Normocephalic and atraumatic.  Eyes:     Conjunctiva/sclera: Conjunctivae normal.  Neck:     Musculoskeletal: Neck supple.  Cardiovascular:     Rate and Rhythm: Normal rate and regular rhythm.     Heart sounds: Normal heart sounds. No murmur.  Pulmonary:     Effort: Pulmonary effort is normal.     Breath sounds: Normal breath sounds.  Abdominal:     General: Bowel sounds are normal. There is no distension.     Palpations: Abdomen is soft.     Tenderness: There is abdominal tenderness (mild suprapubic).  Musculoskeletal:     Right lower leg: No edema.     Left lower leg: No edema.  Skin:    General: Skin is warm and dry.  Neurological:     Mental Status: She is alert and  oriented to person, place, and time.     Comments: Fluent speech  Psychiatric:        Judgment: Judgment normal.     Comments: tearful      ED Treatments / Results  Labs (all labs ordered are listed, but only abnormal results are displayed) Labs Reviewed  URINALYSIS, ROUTINE W REFLEX MICROSCOPIC - Abnormal; Notable for the following components:      Result Value   APPearance CLOUDY (*)    Hgb urine dipstick SMALL (*)    Leukocytes,Ua MODERATE (*)    All other components within normal limits  URINALYSIS, MICROSCOPIC (REFLEX) - Abnormal; Notable for the following components:   Bacteria, UA FEW (*)    All other components within normal limits  URINE CULTURE    EKG None  Radiology No results found.  Procedures Procedures (including critical care time)  Medications Ordered in ED Medications - No data to display   Initial Impression / Assessment and Plan / ED Course  I have reviewed the triage vital signs and the nursing notes.  Pertinent labs that were available during my care of the patient were reviewed by me and considered in my medical decision making (see chart for details).       Very mild suprapubic tenderness on exam.  Today's urinalysis shows evidence of infection, moderate leukocytes, RBCs and WBCs.  Added urine culture.  Will treat with course of Keflex.  Provided with Zofran and Pyridium to use for symptom relief.  Reviewed supportive measures and return precautions.  Final Clinical Impressions(s) / ED Diagnoses   Final diagnoses:  Acute cystitis with hematuria  Body aches  Nausea    ED Discharge Orders         Ordered    cephALEXin (KEFLEX) 500 MG capsule  2 times daily     08/26/18 1105    phenazopyridine (PYRIDIUM) 200 MG tablet  3 times daily PRN     08/26/18 1105    ondansetron (ZOFRAN ODT) 4 MG disintegrating tablet  Every 8 hours PRN     08/26/18 1105           Annabel Gibeau, Wenda Overland, MD 08/26/18 1132

## 2018-08-26 NOTE — ED Triage Notes (Signed)
Pt reports she feels worse today; was seen 2 days ago for same sxs- dysuria and body aches

## 2018-08-28 LAB — URINE CULTURE: Culture: 40000 — AB

## 2018-11-02 ENCOUNTER — Other Ambulatory Visit: Payer: Self-pay

## 2018-11-02 ENCOUNTER — Encounter (HOSPITAL_BASED_OUTPATIENT_CLINIC_OR_DEPARTMENT_OTHER): Payer: Self-pay | Admitting: *Deleted

## 2018-11-02 ENCOUNTER — Emergency Department (HOSPITAL_BASED_OUTPATIENT_CLINIC_OR_DEPARTMENT_OTHER)
Admission: EM | Admit: 2018-11-02 | Discharge: 2018-11-02 | Disposition: A | Discharge status: Home / Self Care | Payer: Self-pay | Attending: Emergency Medicine | Admitting: Emergency Medicine

## 2018-11-02 ENCOUNTER — Emergency Department (HOSPITAL_BASED_OUTPATIENT_CLINIC_OR_DEPARTMENT_OTHER): Payer: Self-pay

## 2018-11-02 DIAGNOSIS — N10 Acute tubulo-interstitial nephritis: Secondary | ICD-10-CM | POA: Insufficient documentation

## 2018-11-02 DIAGNOSIS — N12 Tubulo-interstitial nephritis, not specified as acute or chronic: Secondary | ICD-10-CM

## 2018-11-02 DIAGNOSIS — Z87891 Personal history of nicotine dependence: Secondary | ICD-10-CM | POA: Insufficient documentation

## 2018-11-02 LAB — URINALYSIS, MICROSCOPIC (REFLEX): WBC, UA: >50 WBC/hpf (ref 0–5)

## 2018-11-02 LAB — COMPREHENSIVE METABOLIC PANEL
ALT: 12 U/L (ref 0–44)
AST: 13 U/L — ABNORMAL LOW (ref 15–41)
Albumin: 4 g/dL (ref 3.5–5.0)
Alkaline Phosphatase: 49 U/L (ref 38–126)
Anion gap: 10 (ref 5–15)
BUN: 10 mg/dL (ref 6–20)
CO2: 22 mmol/L (ref 22–32)
Calcium: 8.8 mg/dL — ABNORMAL LOW (ref 8.9–10.3)
Chloride: 101 mmol/L (ref 98–111)
Creatinine, Ser: 0.69 mg/dL (ref 0.44–1.00)
GFR calc Af Amer: >60 mL/min (ref >60)
GFR calc non Af Amer: >60 mL/min (ref >60)
Glucose, Bld: 97 mg/dL (ref 70–99)
Potassium: 3.7 mmol/L (ref 3.5–5.1)
Sodium: 133 mmol/L — ABNORMAL LOW (ref 135–145)
Total Bilirubin: 1 mg/dL (ref 0.3–1.2)
Total Protein: 7.2 g/dL (ref 6.5–8.1)

## 2018-11-02 LAB — CBC WITH DIFFERENTIAL/PLATELET
Abs Immature Granulocytes: 0.04 10*3/uL (ref 0.00–0.07)
Basophils Absolute: 0 10*3/uL (ref 0.0–0.1)
Basophils Relative: 0 %
Eosinophils Absolute: 0 10*3/uL (ref 0.0–0.5)
Eosinophils Relative: 0 %
HCT: 41.2 % (ref 36.0–46.0)
Hemoglobin: 13.7 g/dL (ref 12.0–15.0)
Immature Granulocytes: 0 %
Lymphocytes Relative: 11 %
Lymphs Abs: 1.2 10*3/uL (ref 0.7–4.0)
MCH: 28.8 pg (ref 26.0–34.0)
MCHC: 33.3 g/dL (ref 30.0–36.0)
MCV: 86.7 fL (ref 80.0–100.0)
Monocytes Absolute: 0.9 10*3/uL (ref 0.1–1.0)
Monocytes Relative: 8 %
Neutro Abs: 9.2 10*3/uL — ABNORMAL HIGH (ref 1.7–7.7)
Neutrophils Relative %: 81 %
Platelets: 174 10*3/uL (ref 150–400)
RBC: 4.75 MIL/uL (ref 3.87–5.11)
RDW: 12.4 % (ref 11.5–15.5)
WBC: 11.4 10*3/uL — ABNORMAL HIGH (ref 4.0–10.5)
nRBC: 0 % (ref 0.0–0.2)

## 2018-11-02 LAB — URINALYSIS, ROUTINE W REFLEX MICROSCOPIC
Bilirubin Urine: NEGATIVE
Glucose, UA: NEGATIVE mg/dL
Hgb urine dipstick: NEGATIVE
Ketones, ur: 15 mg/dL — AB
Nitrite: POSITIVE — AB
Protein, ur: NEGATIVE mg/dL
Specific Gravity, Urine: 1.015 (ref 1.005–1.030)
pH: 7 (ref 5.0–8.0)

## 2018-11-02 LAB — PREGNANCY, URINE: Preg Test, Ur: NEGATIVE

## 2018-11-02 LAB — LACTIC ACID, PLASMA: Lactic Acid, Venous: 0.7 mmol/L (ref 0.5–1.9)

## 2018-11-02 MED ORDER — MORPHINE SULFATE (PF) 4 MG/ML IV SOLN
4.0000 mg | Freq: Once | INTRAVENOUS | Status: AC
  Administered 2018-11-02: 4 mg via INTRAVENOUS
  Filled 2018-11-02: qty 1

## 2018-11-02 MED ORDER — SODIUM CHLORIDE 0.9 % IV BOLUS
1000.0000 mL | Freq: Once | INTRAVENOUS | Status: AC
  Administered 2018-11-02: 1000 mL via INTRAVENOUS

## 2018-11-02 MED ORDER — CEPHALEXIN 500 MG PO CAPS: 500.0000 mg | ORAL_CAPSULE | Freq: Four times a day (QID) | ORAL | 0 refills | Status: DC

## 2018-11-02 MED ORDER — SODIUM CHLORIDE 0.9 % IV SOLN
1.0000 g | Freq: Once | INTRAVENOUS | Status: AC
  Administered 2018-11-02: 11:00:00 1 g via INTRAVENOUS
  Filled 2018-11-02: qty 10

## 2018-11-02 MED ORDER — ACETAMINOPHEN 325 MG PO TABS
650.0000 mg | ORAL_TABLET | Freq: Once | ORAL | Status: AC
  Administered 2018-11-02: 650 mg via ORAL
  Filled 2018-11-02: qty 2

## 2018-11-02 NOTE — ED Provider Notes (Signed)
[facility] EMERGENCY DEPARTMENT Provider Note   CSN: [id number] Arrival date & time: 11/02/18  0920     History   Chief Complaint Chief Complaint  Patient presents with   Flank pain    HPI Heidi Bowman is a 28 y.o. female.  She is complaining of cute onset of body aches right flank pain some dysuria fevers and chills that started last night.  She said she had a urine infection about 2 months ago that had improved.  No cough no chest pain no shortness of breath no nausea vomiting diarrhea.  Last menstrual.  3 weeks ago.  Tried some Tylenol last night.     The history is provided by the patient.  Flank Pain This is a new problem. The current episode started yesterday. The problem occurs constantly. The problem has not changed since onset.Pertinent negatives include no chest pain, no abdominal pain, no headaches and no shortness of breath. The symptoms are aggravated by bending and twisting. Nothing relieves the symptoms. She has tried acetaminophen for the symptoms. The treatment provided no relief.    Past Medical History:  Diagnosis Date   Anxiety    Kidney problem    Recurrent UTI     There are no active problems to display for this patient.   History reviewed. No pertinent surgical history.   OB History    Gravida  1   Para      Term      Preterm      AB  1   Living        SAB      TAB      Ectopic      Multiple      Live Births               Home Medications    Prior to Admission medications   Medication Sig Start Date End Date Taking? Authorizing Provider  albuterol (PROVENTIL HFA;VENTOLIN HFA) 108 (90 Base) MCG/ACT inhaler Inhale 1-2 puffs into the lungs every 6 (six) hours as needed for wheezing or shortness of breath. 03/26/17 08/24/18  Rise MuLeaphart, Kenneth T, PA-C  sertraline (ZOLOFT) 50 MG tablet Take 1 tablet (50 mg total) by mouth daily. 03/29/14 08/24/18  Lattie HawBeese, Stephen A, MD    Family History No family history on  file.  Social History Social History   Tobacco Use   Smoking status: Former Smoker    Packs/day: 0.50    Types: Cigarettes   Smokeless tobacco: Never Used  Substance Use Topics   Alcohol use: Not Currently   Drug use: No     Allergies   Patient has no known allergies.   Review of Systems Review of Systems  Constitutional: Positive for chills and fever.  HENT: Negative for sore throat.   Eyes: Negative for visual disturbance.  Respiratory: Negative for shortness of breath.   Cardiovascular: Negative for chest pain.  Gastrointestinal: Negative for abdominal pain.  Genitourinary: Positive for dysuria and flank pain. Negative for hematuria and vaginal bleeding.  Musculoskeletal: Positive for back pain and myalgias.  Skin: Negative for rash.  Neurological: Negative for headaches.     Physical Exam Updated Vital Signs BP (!) 138/103 (BP Location: Right Arm)    Pulse 98    Temp 99.5 F (37.5 C) (Oral)    Resp (!) 22    Ht 5\' 7"  (1.702 m)    Wt 63.5 kg    LMP 10/09/2018    SpO2 100%  BMI 21.93 kg/m   Physical Exam Vitals signs and nursing note reviewed.  Constitutional:      General: She is not in acute distress.    Appearance: She is well-developed.  HENT:     Head: Normocephalic and atraumatic.  Eyes:     Conjunctiva/sclera: Conjunctivae normal.  Neck:     Musculoskeletal: Neck supple.  Cardiovascular:     Rate and Rhythm: Normal rate and regular rhythm.     Heart sounds: No murmur.  Pulmonary:     Effort: Pulmonary effort is normal. No respiratory distress.     Breath sounds: Normal breath sounds.  Abdominal:     Palpations: Abdomen is soft.     Tenderness: There is no abdominal tenderness. There is right CVA tenderness. There is no guarding or rebound.  Musculoskeletal: Normal range of motion.     Right lower leg: No edema.     Left lower leg: No edema.  Skin:    General: Skin is warm and dry.  Neurological:     General: No focal deficit present.      Mental Status: She is alert.      ED Treatments / Results  Labs (all labs ordered are listed, but only abnormal results are displayed) Labs Reviewed  COMPREHENSIVE METABOLIC PANEL - Abnormal; Notable for the following components:      Result Value   Sodium 133 (*)    Calcium 8.8 (*)    AST 13 (*)    All other components within normal limits  CBC WITH DIFFERENTIAL/PLATELET - Abnormal; Notable for the following components:   WBC 11.4 (*)    Neutro Abs 9.2 (*)    All other components within normal limits  URINALYSIS, ROUTINE W REFLEX MICROSCOPIC - Abnormal; Notable for the following components:   APPearance CLOUDY (*)    Ketones, ur 15 (*)    Nitrite POSITIVE (*)    Leukocytes,Ua SMALL (*)    All other components within normal limits  URINALYSIS, MICROSCOPIC (REFLEX) - Abnormal; Notable for the following components:   Bacteria, UA MANY (*)    All other components within normal limits  CULTURE, BLOOD (ROUTINE X 2)  CULTURE, BLOOD (ROUTINE X 2)  URINE CULTURE  LACTIC ACID, PLASMA  PREGNANCY, URINE    EKG None  Radiology Ct Renal Stone Study  Result Date: 11/02/2018 CLINICAL DATA:  28 year old female with history of flank pain an urinary difficulty. EXAM: CT ABDOMEN AND PELVIS WITHOUT CONTRAST TECHNIQUE: Multidetector CT imaging of the abdomen and pelvis was performed following the standard protocol without IV contrast. COMPARISON:  No priors. FINDINGS: Lower chest: Unremarkable. Hepatobiliary: No definite suspicious cystic or solid hepatic lesions are confidently identified on today's noncontrast CT examination. Unenhanced appearance of the gallbladder is normal. Pancreas: No definite pancreatic mass or peripancreatic fluid collections or inflammatory changes are noted on today's noncontrast CT examination. Spleen: Unremarkable. Adrenals/Urinary Tract: There are no abnormal calcifications within the collecting system of either kidney, along the course of either ureter, or  within the lumen of the urinary bladder. No hydroureteronephrosis or perinephric stranding to suggest urinary tract obstruction at this time. Multifocal cortical thinning in the right kidney likely post infectious or inflammatory scarring. 1.3 x 0.7 cm low-attenuation lesion in the interpolar region of the right kidney, incompletely characterized on today's noncontrast CT examination. Unenhanced appearance of the left kidney is normal. Bilateral adrenal glands are normal in appearance. Urinary bladder is normal in appearance. Stomach/Bowel: Unenhanced appearance of the stomach is normal. No  pathologic dilatation of small bowel or colon. Normal appendix. Vascular/Lymphatic: No atherosclerotic calcifications in the abdominal aorta or pelvic vasculature. No lymphadenopathy noted in the abdomen or pelvis. Reproductive: Uterus and ovaries are unremarkable in appearance. Other: Small amount of fluid in the cul-de-sac. No pneumoperitoneum. Musculoskeletal: There are no aggressive appearing lytic or blastic lesions noted in the visualized portions of the skeleton. IMPRESSION: 1. Small amount of free fluid in the low anatomic pelvis, presumably physiologic in this young female patient. 2. No other acute findings are noted in the abdomen or pelvis. Specifically, no urinary tract calculi no findings of urinary tract obstruction. 3. Multifocal cortical thinning in the right kidney, likely sequela of prior infections. Electronically Signed   By: Vinnie Langton M.D.   On: 11/02/2018 11:00    Procedures Procedures (including critical care time)  Medications Ordered in ED Medications  sodium chloride 0.9 % bolus 1,000 mL (0 mLs Intravenous Stopped 11/02/18 1111)  morphine 4 MG/ML injection 4 mg (4 mg Intravenous Given 11/02/18 1004)  acetaminophen (TYLENOL) tablet 650 mg (650 mg Oral Given 11/02/18 0953)  cefTRIAXone (ROCEPHIN) 1 g in sodium chloride 0.9 % 100 mL IVPB (0 g Intravenous Stopped 11/02/18 1203)     Initial  Impression / Assessment and Plan / ED Course  I have reviewed the triage vital signs and the nursing notes.  Pertinent labs & imaging results that were available during my care of the patient were reviewed by me and considered in my medical decision making (see chart for details).  Clinical Course as of Nov 02 1710  Sun Nov 01, 3741  5427 28 year old female here with myalgias arthralgias fever and right flank pain.  Differential includes pyelonephritis, Covid, renal colic, pneumonia, biliary colic, cholecystitis   [MB]  46 Patient's lab work significant for nitrite positive urine with greater than 50 whites.  Her renal function is okay and her CT does not show any signs of stone.  Given her some pain medicine fluids and IV ceftriaxone and she is feeling better.  She will likely be discharged on oral antibiotics and follow-up with urology because she seems to be getting frequent UTIs.   [MB]    Clinical Course User Index [MB] Hayden Rasmussen, MD   Heidi Bowman was evaluated in Emergency Department on 11/02/2018 for the symptoms described in the history of present illness. She was evaluated in the context of the global COVID-19 pandemic, which necessitated consideration that the patient might be at risk for infection with the SARS-CoV-2 virus that causes COVID-19. Institutional protocols and algorithms that pertain to the evaluation of patients at risk for COVID-19 are in a state of rapid change based on information released by regulatory bodies including the CDC and federal and state organizations. These policies and algorithms were followed during the patient's care in the ED.     Final Clinical Impressions(s) / ED Diagnoses   Final diagnoses:  Pyelonephritis    ED Discharge Orders         Ordered    cephALEXin (KEFLEX) 500 MG capsule  4 times daily     11/02/18 1218           Hayden Rasmussen, MD 11/02/18 [phone]

## 2018-11-02 NOTE — Discharge Instructions (Signed)
You were seen in the emergency department for fevers chills and right flank pain.  Your testing showed that you have a kidney infection.  You received IV antibiotics here and you will need to continue oral antibiotics for 10 days.  Please drink plenty of fluids.  Tylenol and ibuprofen for pain.  Return if any worsening symptoms.  Because you have had multiple urinary tract infections you should also follow-up with urology.

## 2018-11-02 NOTE — ED Triage Notes (Signed)
Right flank pain started last night.

## 2018-11-02 NOTE — ED Notes (Signed)
Pt states she feels "much better" 

## 2018-11-04 LAB — URINE CULTURE: Culture: >=100000 — AB

## 2018-11-06 ENCOUNTER — Telehealth: Payer: Self-pay | Admitting: *Deleted

## 2018-11-06 NOTE — Telephone Encounter (Signed)
Post ED Visit - Positive Culture Follow-up  Culture report reviewed by antimicrobial stewardship pharmacist: [place] Team []  Elenor Quinones, Pharm.D. []  Heide Guile, Pharm.D., BCPS AQ-ID []  Parks Neptune, Pharm.D., BCPS []  Alycia Rossetti, Pharm.D., BCPS []  [place], [place].D., BCPS, AAHIVP []  Legrand Como, Pharm.D., BCPS, AAHIVP [x]  Salome Arnt, PharmD, BCPS []  Johnnette Gourd, PharmD, BCPS []  Hughes Better, PharmD, BCPS []  Leeroy Cha, PharmD []  Laqueta Linden, PharmD, BCPS []  Albertina Parr, PharmD  [place] Team []  Leodis Sias, PharmD []  Lindell Spar, PharmD []  Royetta Asal, PharmD []  Graylin Shiver, Rph []  Rema Fendt) Glennon Mac, PharmD []  Arlyn Dunning, PharmD []  Netta Cedars, PharmD []  Dia Sitter, PharmD []  Leone Haven, PharmD []  Gretta Arab, PharmD []  Theodis Shove, PharmD []  Peggyann Juba, PharmD []  Reuel Boom, PharmD   Positive urine culture Treated with Cephalexin, organism sensitive to the same and no further patient follow-up is required at this time.  Harlon Flor [facility] 11/06/2018, 3:32 PM

## 2018-11-07 LAB — CULTURE, BLOOD (ROUTINE X 2)
Culture: NO GROWTH
Culture: NO GROWTH
Special Requests: ADEQUATE
Special Requests: ADEQUATE

## 2019-04-29 ENCOUNTER — Encounter (HOSPITAL_BASED_OUTPATIENT_CLINIC_OR_DEPARTMENT_OTHER): Payer: Self-pay | Admitting: *Deleted

## 2019-04-29 ENCOUNTER — Other Ambulatory Visit: Payer: Self-pay

## 2019-04-29 ENCOUNTER — Emergency Department (HOSPITAL_BASED_OUTPATIENT_CLINIC_OR_DEPARTMENT_OTHER)
Admission: EM | Admit: 2019-04-29 | Discharge: 2019-04-29 | Disposition: A | Discharge status: Home / Self Care | Payer: Self-pay | Attending: Emergency Medicine | Admitting: Emergency Medicine

## 2019-04-29 DIAGNOSIS — Z87891 Personal history of nicotine dependence: Secondary | ICD-10-CM | POA: Insufficient documentation

## 2019-04-29 DIAGNOSIS — N3001 Acute cystitis with hematuria: Secondary | ICD-10-CM | POA: Insufficient documentation

## 2019-04-29 LAB — URINALYSIS, ROUTINE W REFLEX MICROSCOPIC
Bilirubin Urine: NEGATIVE
Glucose, UA: NEGATIVE mg/dL
Ketones, ur: NEGATIVE mg/dL
Nitrite: POSITIVE — AB
Protein, ur: NEGATIVE mg/dL
Specific Gravity, Urine: 1.02 (ref 1.005–1.030)
pH: 6 (ref 5.0–8.0)

## 2019-04-29 LAB — URINALYSIS, MICROSCOPIC (REFLEX): WBC, UA: >50 WBC/hpf (ref 0–5)

## 2019-04-29 LAB — PREGNANCY, URINE: Preg Test, Ur: NEGATIVE

## 2019-04-29 MED ORDER — SULFAMETHOXAZOLE-TRIMETHOPRIM 800-160 MG PO TABS
1.0000 | ORAL_TABLET | Freq: Once | ORAL | Status: AC
  Administered 2019-04-29: 09:00:00 1 via ORAL
  Filled 2019-04-29: qty 1

## 2019-04-29 MED ORDER — ALIGN 4 MG PO CAPS: 1.0000 | ORAL_CAPSULE | Freq: Two times a day (BID) | ORAL | 0 refills | Status: DC | PRN

## 2019-04-29 MED ORDER — PHENAZOPYRIDINE HCL 200 MG PO TABS: 200.0000 mg | ORAL_TABLET | Freq: Three times a day (TID) | ORAL | 0 refills | Status: DC

## 2019-04-29 MED ORDER — FLUCONAZOLE 150 MG PO TABS: 150.0000 mg | ORAL_TABLET | Freq: Every day | ORAL | 0 refills | Status: AC

## 2019-04-29 MED ORDER — SULFAMETHOXAZOLE-TRIMETHOPRIM 800-160 MG PO TABS: 1.0000 | ORAL_TABLET | Freq: Two times a day (BID) | ORAL | 0 refills | Status: AC

## 2019-04-29 MED ORDER — IBUPROFEN 400 MG PO TABS
600.0000 mg | ORAL_TABLET | Freq: Once | ORAL | Status: AC
  Administered 2019-04-29: 600 mg via ORAL
  Filled 2019-04-29: qty 1

## 2019-04-29 MED ORDER — PHENAZOPYRIDINE HCL 100 MG PO TABS
200.0000 mg | ORAL_TABLET | Freq: Once | ORAL | Status: AC
  Administered 2019-04-29: 09:00:00 200 mg via ORAL
  Filled 2019-04-29: qty 2

## 2019-04-29 MED FILL — FLUCONAZOLE 150 MG TABS: 150 | 1 days supply | Qty: 1 | Fill #0

## 2019-04-29 MED FILL — SULFAMETHOXAZOLE-TMP DS TAB: 800-160 | 7 days supply | Qty: 14 | Fill #0

## 2019-04-29 MED FILL — ALIGN 4 MG CAPSULE: 14 days supply | Qty: 28 | Fill #0

## 2019-04-29 NOTE — ED Notes (Signed)
ED Provider at bedside. 

## 2019-04-29 NOTE — ED Triage Notes (Signed)
Painful urination for 2 days.

## 2019-04-29 NOTE — ED Provider Notes (Signed)
[facility] EMERGENCY DEPARTMENT Provider Note   CSN: [id number] Arrival date & time: 04/29/19  9924     History Chief Complaint  Patient presents with  . Frequent Urination    Heidi Bowman is a 29 y.o. female.  Pt presents to the ED today with a possible UTI.  Pt says she gets frequent UTIs, but has not had one in several months.  Pt's last urine culture (10/2018) grew out Klebsiella sensitive to all but ampicillin.  Pt said current sx started 2 days ago.        Past Medical History:  Diagnosis Date  . Anxiety   . Kidney problem   . Recurrent UTI     There are no problems to display for this patient.   No past surgical history on file.   OB History    Gravida  1   Para      Term      Preterm      AB  1   Living        SAB      TAB      Ectopic      Multiple      Live Births              History reviewed. No pertinent family history.  Social History   Tobacco Use  . Smoking status: Former Smoker    Packs/day: 0.50    Types: Cigarettes  . Smokeless tobacco: Never Used  Substance Use Topics  . Alcohol use: Yes    Comment: occasionally  . Drug use: No    Home Medications Prior to Admission medications   Medication Sig Start Date End Date Taking? Authorizing Provider  fluconazole (DIFLUCAN) 150 MG tablet Take 1 tablet (150 mg total) by mouth daily for 1 dose. 04/29/19 04/30/19  Isla Pence, MD  phenazopyridine (PYRIDIUM) 200 MG tablet Take 1 tablet (200 mg total) by mouth 3 (three) times daily. 04/29/19   Isla Pence, MD  Probiotic Product (ALIGN) 4 MG CAPS Take 1 capsule (4 mg total) by mouth 2 (two) times daily as needed. Take while on antibiotics 04/29/19   Isla Pence, MD  sulfamethoxazole-trimethoprim (BACTRIM DS) 800-160 MG tablet Take 1 tablet by mouth 2 (two) times daily for 7 days. 04/29/19 05/06/19  Isla Pence, MD  albuterol (PROVENTIL HFA;VENTOLIN HFA) 108 (90 Base) MCG/ACT inhaler Inhale 1-2 puffs into  the lungs every 6 (six) hours as needed for wheezing or shortness of breath. 03/26/17 08/24/18  Doristine Devoid, PA-C  sertraline (ZOLOFT) 50 MG tablet Take 1 tablet (50 mg total) by mouth daily. 03/29/14 08/24/18  Kandra Nicolas, MD    Allergies    Patient has no known allergies.  Review of Systems   Review of Systems  Genitourinary: Positive for dysuria and frequency.  All other systems reviewed and are negative.   Physical Exam Updated Vital Signs BP 122/73 (BP Location: Right Arm)   Pulse 81   Temp 98.3 F (36.8 C) (Oral)   Resp 16   LMP 04/13/2019   SpO2 100%   Physical Exam Vitals and nursing note reviewed.  Constitutional:      Appearance: Normal appearance.  HENT:     Head: Normocephalic and atraumatic.     Right Ear: External ear normal.     Left Ear: External ear normal.     Nose: Nose normal.     Mouth/Throat:     Mouth: Mucous membranes are moist.  Pharynx: Oropharynx is clear.  Eyes:     Extraocular Movements: Extraocular movements intact.     Conjunctiva/sclera: Conjunctivae normal.     Pupils: Pupils are equal, round, and reactive to light.  Cardiovascular:     Rate and Rhythm: Normal rate and regular rhythm.     Pulses: Normal pulses.     Heart sounds: Normal heart sounds.  Pulmonary:     Effort: Pulmonary effort is normal.     Breath sounds: Normal breath sounds.  Abdominal:     General: Abdomen is flat. Bowel sounds are normal.     Palpations: Abdomen is soft.     Tenderness: There is abdominal tenderness in the suprapubic area.  Musculoskeletal:        General: Normal range of motion.     Cervical back: Normal range of motion and neck supple.  Skin:    General: Skin is warm.     Capillary Refill: Capillary refill takes less than 2 seconds.  Neurological:     General: No focal deficit present.     Mental Status: She is alert and oriented to person, place, and time.  Psychiatric:        Mood and Affect: Mood normal.        Behavior:  Behavior normal.        Thought Content: Thought content normal.        Judgment: Judgment normal.     ED Results / Procedures / Treatments   Labs (all labs ordered are listed, but only abnormal results are displayed) Labs Reviewed  URINALYSIS, ROUTINE W REFLEX MICROSCOPIC - Abnormal; Notable for the following components:      Result Value   APPearance CLOUDY (*)    Hgb urine dipstick TRACE (*)    Nitrite POSITIVE (*)    Leukocytes,Ua SMALL (*)    All other components within normal limits  URINALYSIS, MICROSCOPIC (REFLEX) - Abnormal; Notable for the following components:   Bacteria, UA MANY (*)    Non Squamous Epithelial PRESENT (*)    All other components within normal limits  PREGNANCY, URINE    EKG None  Radiology No results found.  Procedures Procedures (including critical care time)  Medications Ordered in ED Medications  phenazopyridine (PYRIDIUM) tablet 200 mg (has no administration in time range)  ibuprofen (ADVIL) tablet 600 mg (has no administration in time range)  sulfamethoxazole-trimethoprim (BACTRIM DS) 800-160 MG per tablet 1 tablet (has no administration in time range)    ED Course  I have reviewed the triage vital signs and the nursing notes.  Pertinent labs & imaging results that were available during my care of the patient were reviewed by me and considered in my medical decision making (see chart for details).    MDM Rules/Calculators/A&P                      Pt does have a UTI.  She will be started on bactrim.  She gets yeast infections while on abx, so she is given a rx for diflucan.  She is also told to take probiotics.  Return if worse.   Final Clinical Impression(s) / ED Diagnoses Final diagnoses:  Acute cystitis with hematuria    Rx / DC Orders ED Discharge Orders         Ordered    sulfamethoxazole-trimethoprim (BACTRIM DS) 800-160 MG tablet  2 times daily     04/29/19 0910    phenazopyridine (PYRIDIUM) 200 MG tablet  3 times  daily     04/29/19 0910    fluconazole (DIFLUCAN) 150 MG tablet  Daily     04/29/19 0910    Probiotic Product (ALIGN) 4 MG CAPS  2 times daily PRN     04/29/19 0910           Jacalyn Lefevre, MD 04/29/19 [phone]

## 2019-10-20 ENCOUNTER — Other Ambulatory Visit: Payer: Self-pay

## 2019-10-20 ENCOUNTER — Emergency Department (HOSPITAL_BASED_OUTPATIENT_CLINIC_OR_DEPARTMENT_OTHER)
Admission: EM | Admit: 2019-10-20 | Discharge: 2019-10-20 | Disposition: A | Discharge status: Home / Self Care | Payer: BC Managed Care – PPO | Attending: Emergency Medicine | Admitting: Emergency Medicine

## 2019-10-20 ENCOUNTER — Encounter (HOSPITAL_BASED_OUTPATIENT_CLINIC_OR_DEPARTMENT_OTHER): Payer: Self-pay | Admitting: *Deleted

## 2019-10-20 DIAGNOSIS — J029 Acute pharyngitis, unspecified: Secondary | ICD-10-CM | POA: Insufficient documentation

## 2019-10-20 DIAGNOSIS — Z5321 Procedure and treatment not carried out due to patient leaving prior to being seen by health care provider: Secondary | ICD-10-CM | POA: Diagnosis not present

## 2019-10-20 DIAGNOSIS — R519 Headache, unspecified: Secondary | ICD-10-CM | POA: Diagnosis not present

## 2019-10-20 NOTE — ED Triage Notes (Signed)
Pt reports sore throat and ha x 2 days, took in home covid test yesterday but could not read the results. Denies any other c/o.

## 2019-10-22 ENCOUNTER — Ambulatory Visit
Admission: RE | Admit: 2019-10-22 | Discharge: 2019-10-22 | Disposition: A | Discharge status: Home / Self Care | Payer: BC Managed Care – PPO | Source: Ambulatory Visit | Attending: Internal Medicine | Admitting: Internal Medicine

## 2019-10-22 ENCOUNTER — Other Ambulatory Visit: Payer: Self-pay

## 2019-10-22 VITALS — BP 127/83 | HR 87 | Temp 98.8°F | Resp 18 | Ht 66.0 in | Wt <= 1120 oz

## 2019-10-22 DIAGNOSIS — R519 Headache, unspecified: Secondary | ICD-10-CM | POA: Diagnosis not present

## 2019-10-22 DIAGNOSIS — R5383 Other fatigue: Secondary | ICD-10-CM

## 2019-10-22 DIAGNOSIS — Z20822 Contact with and (suspected) exposure to covid-19: Secondary | ICD-10-CM | POA: Diagnosis not present

## 2019-10-22 LAB — CBC WITH DIFFERENTIAL/PLATELET
Basophils Absolute: 0 10*3/uL (ref 0.0–0.1)
Basophils Relative: 0 %
Eosinophils Absolute: 0.1 10*3/uL (ref 0.0–0.5)
Eosinophils Relative: 1 %
HCT: 43.2 % (ref 36.0–46.0)
Hemoglobin: 14.8 g/dL (ref 12.0–15.0)
Lymphocytes Relative: 25 %
Lymphs Abs: 2.3 10*3/uL (ref 0.7–4.0)
MCH: 30 pg (ref 26.0–34.0)
MCHC: 34.3 g/dL (ref 30.0–36.0)
MCV: 87.4 fL (ref 80.0–100.0)
Monocytes Absolute: 0.5 10*3/uL (ref 0.1–1.0)
Monocytes Relative: 6 %
Neutro Abs: 6 10*3/uL (ref 1.7–7.7)
Neutrophils Relative %: 67 %
Platelets: 202 10*3/uL (ref 150–400)
RBC: 4.94 MIL/uL (ref 3.87–5.11)
RDW: 12.5 % (ref 11.5–15.5)
WBC: 8.9 10*3/uL (ref 4.0–10.5)

## 2019-10-22 LAB — MONONUCLEOSIS SCREEN: Mono Screen: NEGATIVE

## 2019-10-22 LAB — COMPREHENSIVE METABOLIC PANEL
ALT: 16 U/L (ref 0–44)
AST: 20 U/L (ref 15–41)
Albumin: 4.3 g/dL (ref 3.5–5.0)
Alkaline Phosphatase: 47 U/L (ref 38–126)
Anion gap: 7 (ref 5–15)
BUN: 11 mg/dL (ref 6–20)
CO2: 28 mmol/L (ref 22–32)
Calcium: 8.8 mg/dL — ABNORMAL LOW (ref 8.9–10.3)
Chloride: 103 mmol/L (ref 98–111)
Creatinine, Ser: 0.71 mg/dL (ref 0.44–1.00)
GFR calc Af Amer: >60 mL/min (ref >60)
GFR calc non Af Amer: >60 mL/min (ref >60)
Glucose, Bld: 90 mg/dL (ref 70–99)
Potassium: 4.2 mmol/L (ref 3.5–5.1)
Sodium: 138 mmol/L (ref 135–145)
Total Bilirubin: 0.7 mg/dL (ref 0.3–1.2)
Total Protein: 7.5 g/dL (ref 6.5–8.1)

## 2019-10-22 LAB — RAPID INFLUENZA A&B ANTIGENS
Influenza A (ARMC): NEGATIVE
Influenza B (ARMC): NEGATIVE

## 2019-10-22 NOTE — ED Triage Notes (Signed)
Pt c/o fatigue, body aches, headache, sore throat. Started about 4 days ago. Denies fever. She was tested for covid on 10/19/19 and was negative.

## 2019-10-22 NOTE — Discharge Instructions (Signed)
If your Covid test ends up positive you may take the following supplements to help your immune system be stronger to fight this viral infection Take Quarcetin 500 mg three times a day x 7 days with Zinc 50 mg ones a day x 7 days. The quarcetin is an antiviral and anti-inflammatory supplement which helps open the zinc channels in the cell to absorb Zinc. Zinc helps decrease the virus load in your body. Take Melatonin 6-10 mg at bed time which also helps support your immune system.  Also make sure to take Vit D 5,000 IU per day with a fatty meal and Vit C 1000 mg a day until you are completely better. Stay on Vitamin D 2,000  and C the rest of the season.  Don't lay around, keep active and walk as much as you are able to to prevent worsening of your symptoms.  Follow up with your family Dr next week.  If you get [place] of breath and you are able to check  your oxygen with a pulse oxygen meter, if it gets to 92% or less, you need to go to the hospital to be admitted. If you dont have one, come back here and we will assess you.   

## 2019-10-22 NOTE — ED Provider Notes (Signed)
[facility]    CSN: [id number] Arrival date & time: 10/22/19  1328      History   Chief Complaint Chief Complaint  Patient presents with   Headache   Fatigue    HPI Heidi Bowman is a 29 y.o. female who presents with extreme fatigue, HA, body aches, but no rhinitis or cough. The other reason to stay in bed is because of moving causes more body pain. Has only had low grade temps of 99.8 without use of Tylenol or Ibuprofen.  Had Covid series  Injection finished in June Has not lost her taste and smell. She also has been working 60+ h  Per week and her mother thinks she is in adrenal fatigue. Pt admits she sleeps a few hours when she works night shift and goes back to work.   Past Medical History:  Diagnosis Date   Anxiety    Kidney problem    Recurrent UTI     There are no problems to display for this patient.   History reviewed. No pertinent surgical history.  OB History    Gravida  1   Para      Term      Preterm      AB  1   Living        SAB      TAB      Ectopic      Multiple      Live Births               Home Medications    Prior to Admission medications   Medication Sig Start Date End Date Taking? Authorizing Provider  albuterol (PROVENTIL HFA;VENTOLIN HFA) 108 (90 Base) MCG/ACT inhaler Inhale 1-2 puffs into the lungs every 6 (six) hours as needed for wheezing or shortness of breath. 03/26/17 08/24/18  Rise Mu, PA-C  sertraline (ZOLOFT) 50 MG tablet Take 1 tablet (50 mg total) by mouth daily. 03/29/14 08/24/18  Lattie Haw, MD    Family History History reviewed. No pertinent family history.  Social History Social History   Tobacco Use   Smoking status: Former Smoker    Packs/day: 0.50    Types: Cigarettes   Smokeless tobacco: Never Used  Building services engineer Use: Every day   Substances: Nicotine  Substance Use Topics   Alcohol use: Yes    Comment: occasionally   Drug use: No     Allergies    Patient has no known allergies.   Review of Systems Review of Systems  Constitutional: Positive for activity change, appetite change, chills, diaphoresis and fatigue. Negative for fever.  HENT: Negative for congestion, rhinorrhea, sinus pain, sore throat and trouble swallowing.   Eyes: Negative for discharge.  Respiratory: Negative for cough and shortness of breath.   Cardiovascular: Negative for chest pain.  Gastrointestinal: Negative for abdominal pain, constipation, diarrhea, nausea and vomiting.  Genitourinary: Negative for difficulty urinating, dysuria, frequency and urgency.  Musculoskeletal: Positive for myalgias. Negative for gait problem.  Skin: Negative for rash.  Allergic/Immunologic: Negative for environmental allergies.  Neurological: Positive for headaches. Negative for dizziness and weakness.  Hematological: Negative for adenopathy.   Physical Exam Triage Vital Signs ED Triage Vitals  Enc Vitals Group     BP 10/22/19 1458 127/83     Pulse Rate 10/22/19 1458 87     Resp 10/22/19 1458 18     Temp 10/22/19 1458 98.8 F (37.1 C)     Temp Source  10/22/19 1458 Oral     SpO2 10/22/19 1458 100 %     Weight 10/22/19 1454 65 lb 11.2 oz (29.8 kg)     Height 10/22/19 1454 5\' 6"  (1.676 m)     Head Circumference --      Peak Flow --      Pain Score 10/22/19 1454 7     Pain Loc --      Pain Edu? --      Excl. in GC? --    No data found.  Updated Vital Signs BP 127/83 (BP Location: Left Arm)    Pulse 87    Temp 98.8 F (37.1 C) (Oral)    Resp 18    Ht 5\' 6"  (1.676 m)    Wt 65 lb 11.2 oz (29.8 kg)    LMP 10/11/2019    SpO2 100%    BMI 10.60 kg/m   Visual Acuity Right Eye Distance:   Left Eye Distance:   Bilateral Distance:    Right Eye Near:   Left Eye Near:    Bilateral Near:     Physical Exam Physical Exam Vitals signs and nursing note reviewed.  Constitutional:      General: She is not in acute distress. She looks tired.     Appearance: Normal appearance.  She is not ill-appearing, toxic-appearing or diaphoretic.  HENT:     Head: Normocephalic.     Right Ear: Tympanic membrane, ear canal and external ear normal.     Left Ear: Tympanic membrane, ear canal and external ear normal.     Nose: Nose normal.     Mouth/Throat: clear    Mouth: Mucous membranes are moist.  Eyes:     General: No scleral icterus.       Right eye: No discharge.        Left eye: No discharge.     Conjunctiva/sclera: Conjunctivae normal.  Neck:     Musculoskeletal: Neck supple. No neck rigidity. Has slight enlargement of R mid anterior chain node which is soft and mobitle and a little tender.  Cardiovascular:     Rate and Rhythm: Normal rate and regular rhythm.     Heart sounds: No murmur.  Pulmonary:     Effort: Pulmonary effort is normal.     Breath sounds: Normal breath sounds.  Abdominal:     General: Bowel sounds are normal. There is no distension.     Palpations: Abdomen is soft. There is no mass.     Tenderness: There is no abdominal tenderness. There is no guarding or rebound.     Hernia: No hernia is present.  Musculoskeletal: Normal range of motion.  Lymphadenopathy:     Cervical: No cervical adenopathy.  Skin:    General: Skin is warm and dry.     Coloration: Skin is not jaundiced.     Findings: No rash.  Neurological:     Mental Status: She is alert and oriented to person, place, and time.     Gait: Gait normal.  Psychiatric:        Mood and Affect: Mood normal.        Behavior: Behavior normal.        Thought Content: Thought content normal.        Judgment: Judgment normal.     UC Treatments / Results  Labs (all labs ordered are listed, but only abnormal results are displayed) Labs Reviewed  RAPID INFLUENZA A&B ANTIGENS  CBC WITH DIFFERENTIAL/PLATELET  MONONUCLEOSIS SCREEN  COMPREHENSIVE METABOLIC PANEL  CBC, PCR strep,  Flu test and mono are normal/neg. CMP is normal except for slightly low calcium.   EKG   Radiology No results  found.  Procedures Procedures (including critical care time)  Medications Ordered in UC Medications - No data to display  Initial Impression / Assessment and Plan / UC Course  I have reviewed the triage vital signs and the nursing notes. Pertinent labs  results that were available during my care of the patient were reviewed by me and considered in my medical decision making (see chart for details). Given a note to be off til Monday to get rest. If myalgias persist, she needs to FU with PCP.    Final Clinical Impressions(s) / UC Diagnoses   Final diagnoses:  Suspected COVID-19 virus infection  Fatigue, unspecified type     Discharge Instructions     If your Covid test ends up positive you may take the following supplements to help your immune system be stronger to fight this viral infection Take Quarcetin 500 mg three times a day x 7 days with Zinc 50 mg ones a day x 7 days. The quarcetin is an antiviral and anti-inflammatory supplement which helps open the zinc channels in the cell to absorb Zinc. Zinc helps decrease the virus load in your body. Take Melatonin 6-10 mg at bed time which also helps support your immune system.  Also make sure to take Vit D 5,000 IU per day with a fatty meal and Vit C 1000 mg a day until you are completely better. Stay on Vitamin D 2,000  and C the rest of the season.  Don't lay around, keep active and walk as much as you are able to to prevent worsening of your symptoms.  Follow up with your family Dr next week.  If you get [place] of breath and you are able to check  your oxygen with a pulse oxygen meter, if it gets to 92% or less, you need to go to the hospital to be admitted. If you dont have one, come back here and we will assess you.      ED Prescriptions    None     PDMP not reviewed this encounter.   Garey Ham, [place] 10/22/19 1635

## 2020-04-29 IMAGING — CT CT RENAL STONE PROTOCOL
2 of 4 series · 15 of 46 positions shown, 17 images · non-contrast
Comparison: No priors.

CLINICAL DATA: 27-year-old female with history of flank pain an
urinary difficulty.

EXAM:
CT ABDOMEN AND PELVIS WITHOUT CONTRAST
TECHNIQUE: Multidetector CT imaging of the abdomen and pelvis was performed
following the standard protocol without IV contrast.

[Series 2: axial st · axial · 0.87mm/px · z∈[-511,-91]mm · 12 of 100 slices shown, 14 images]
[im 8/100  soft-tissue]
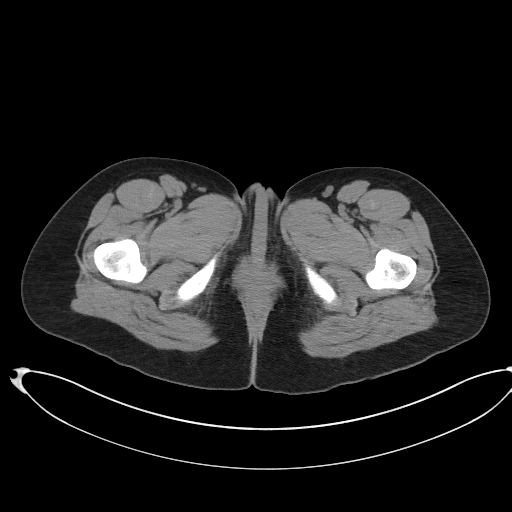
[im 8/100  bone]
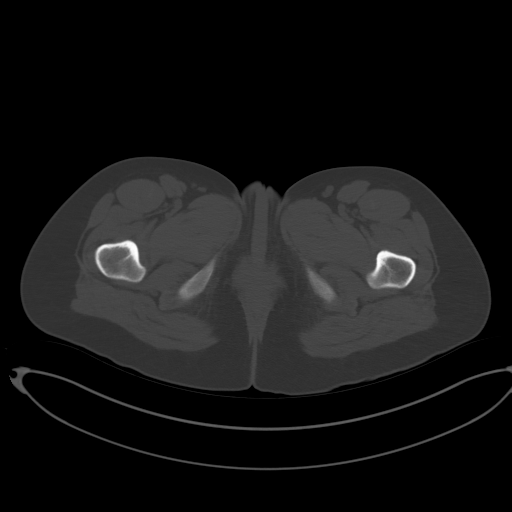
[im 16/100  soft-tissue]
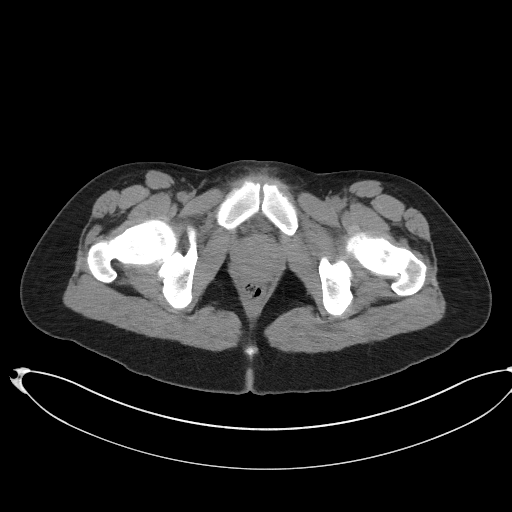
[im 23/100  soft-tissue]
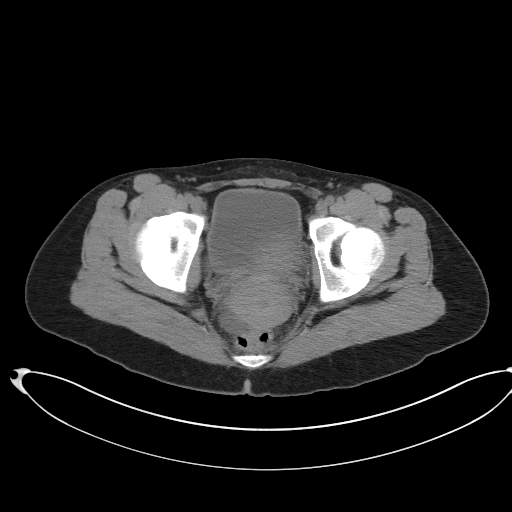
[im 31/100  soft-tissue]
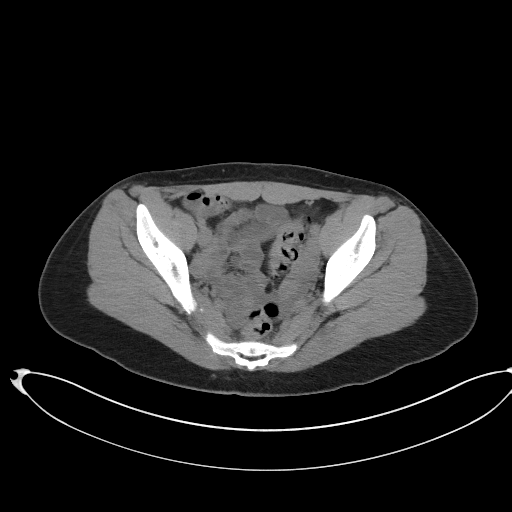
[im 39/100  soft-tissue]
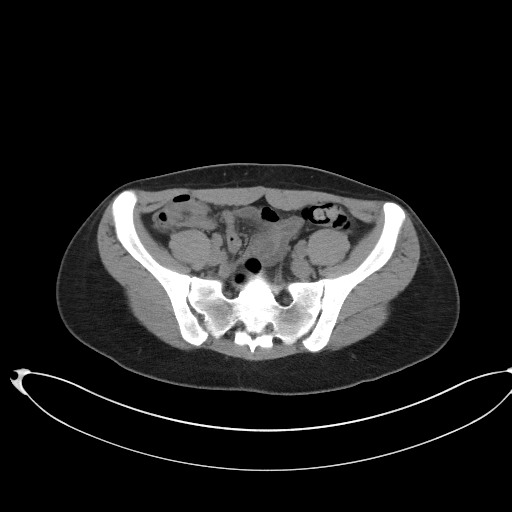
[im 46/100  soft-tissue]
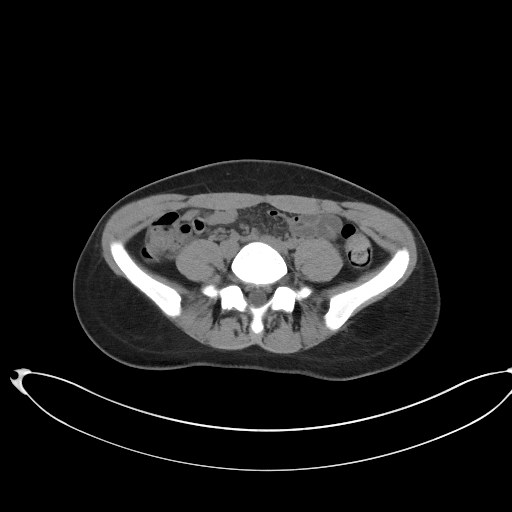
[im 54/100  soft-tissue]
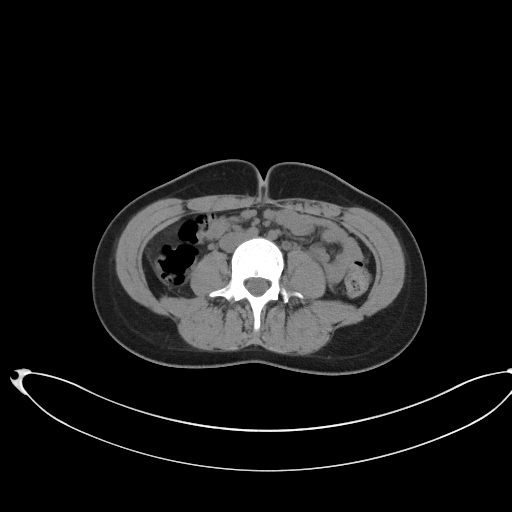
[im 61/100  soft-tissue]
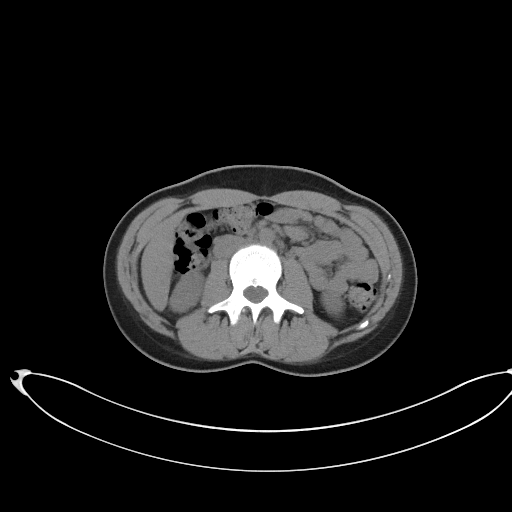
[im 69/100  soft-tissue]
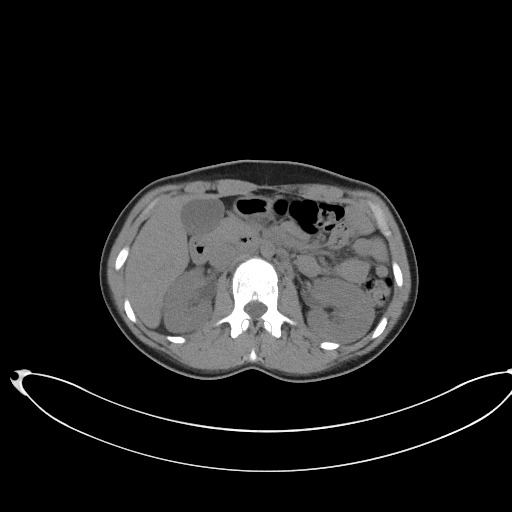
[im 69/100  bone]
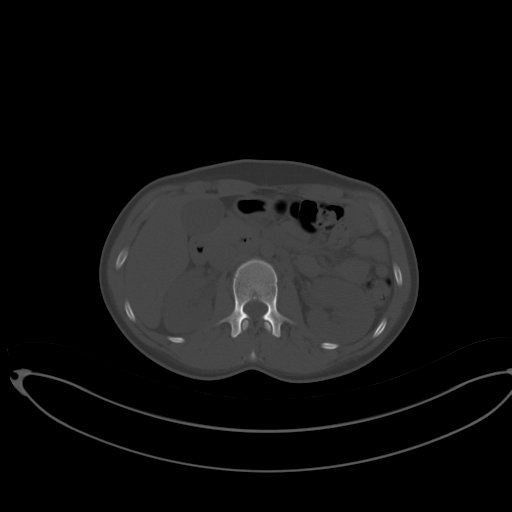
[im 77/100  soft-tissue]
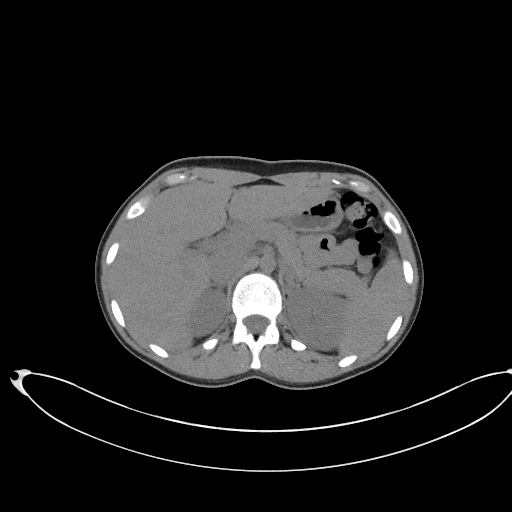
[im 84/100  soft-tissue]
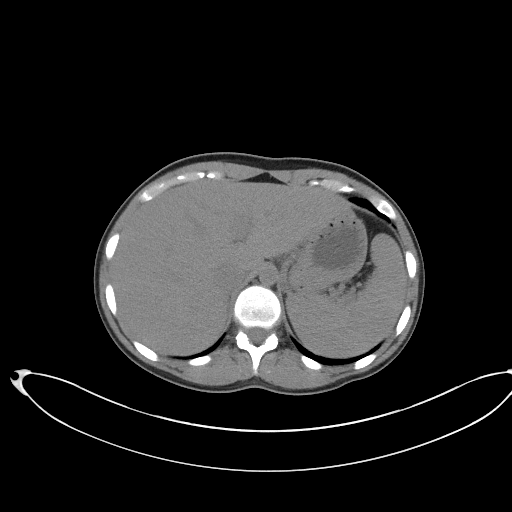
[im 92/100  soft-tissue]
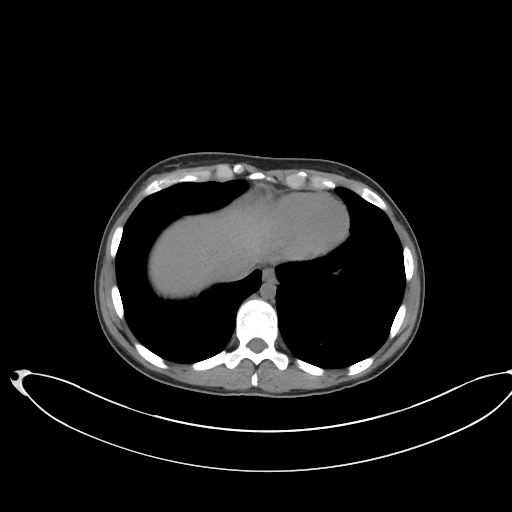

[Series 5: coronal st · coronal · 0.73mm/px · 3 of 66 slices shown]
[im 22/66  soft-tissue]
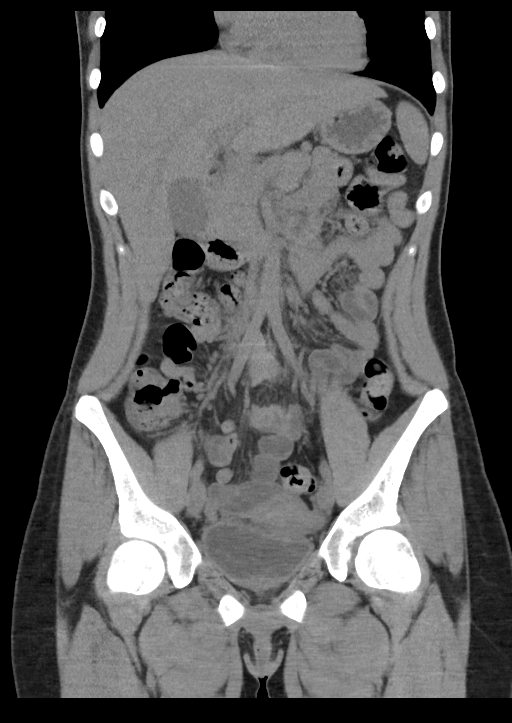
[im 29/66  soft-tissue]
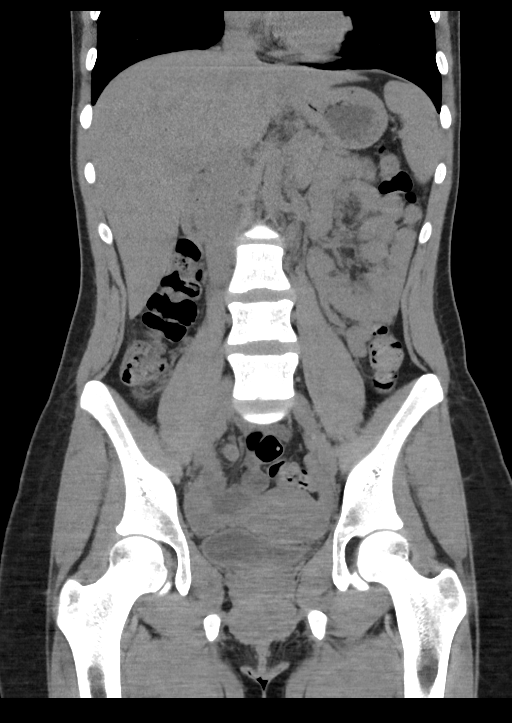
[im 37/66  soft-tissue]
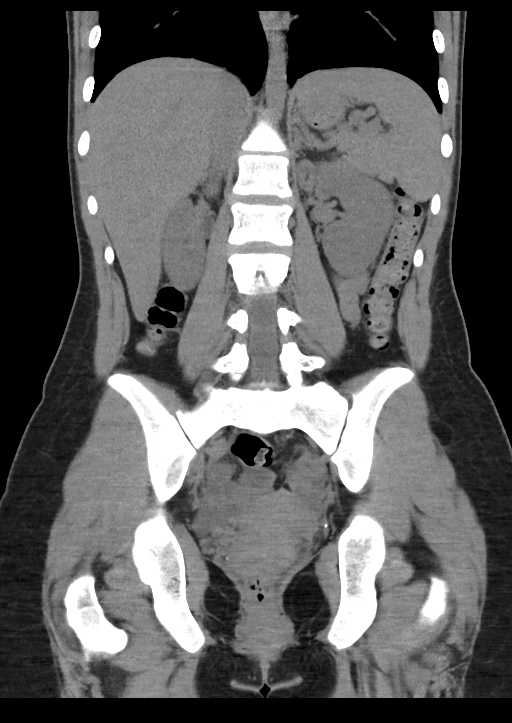

[15 of 46 positions shown; findings below may reference images not displayed]

FINDINGS: Lower chest: Unremarkable.

Hepatobiliary: No definite suspicious cystic or solid hepatic
lesions are confidently identified on today's noncontrast CT
examination. Unenhanced appearance of the gallbladder is normal.

Pancreas: No definite pancreatic mass or peripancreatic fluid
collections or inflammatory changes are noted on today's noncontrast
CT examination.

Spleen: Unremarkable.

Adrenals/Urinary Tract: There are no abnormal calcifications within
the collecting system of either kidney, along the course of either
ureter, or within the lumen of the urinary bladder. No
hydroureteronephrosis or perinephric stranding to suggest urinary
tract obstruction at this time. Multifocal cortical thinning in the
right kidney likely post infectious or inflammatory scarring. 1.3 x
0.7 cm low-attenuation lesion in the interpolar region of the right
kidney, incompletely characterized on today's noncontrast CT
examination. Unenhanced appearance of the left kidney is normal.
Bilateral adrenal glands are normal in appearance. Urinary bladder
is normal in appearance.

Stomach/Bowel: Unenhanced appearance of the stomach is normal. No
pathologic dilatation of small bowel or colon. Normal appendix.

Vascular/Lymphatic: No atherosclerotic calcifications in the
abdominal aorta or pelvic vasculature. No lymphadenopathy noted in
the abdomen or pelvis.

Reproductive: Uterus and ovaries are unremarkable in appearance.

Other: Small amount of fluid in the cul-de-sac. No pneumoperitoneum.

Musculoskeletal: There are no aggressive appearing lytic or blastic
lesions noted in the visualized portions of the skeleton.
IMPRESSION: 1. Small amount of free fluid in the low anatomic pelvis, presumably
physiologic in this young female patient.
2. No other acute findings are noted in the abdomen or pelvis.
Specifically, no urinary tract calculi no findings of urinary tract
obstruction.
3. Multifocal cortical thinning in the right kidney, likely sequela
of prior infections.
